# Patient Record
Sex: Female | Born: 1970
Health system: Southern US, Community
[De-identification: ages and names within clinical notes are randomized; demographics above are authoritative.]

## PROBLEM LIST (undated history)

## (undated) DIAGNOSIS — E781 Pure hyperglyceridemia: Secondary | ICD-10-CM

## (undated) DIAGNOSIS — F329 Major depressive disorder, single episode, unspecified: Secondary | ICD-10-CM

## (undated) DIAGNOSIS — R079 Chest pain, unspecified: Secondary | ICD-10-CM

## (undated) DIAGNOSIS — E669 Obesity, unspecified: Secondary | ICD-10-CM

## (undated) DIAGNOSIS — E282 Polycystic ovarian syndrome: Secondary | ICD-10-CM

## (undated) DIAGNOSIS — Z720 Tobacco use: Secondary | ICD-10-CM

## (undated) DIAGNOSIS — F419 Anxiety disorder, unspecified: Secondary | ICD-10-CM

## (undated) DIAGNOSIS — F32A Depression, unspecified: Secondary | ICD-10-CM

## (undated) DIAGNOSIS — R51 Headache: Secondary | ICD-10-CM

## (undated) DIAGNOSIS — J45909 Unspecified asthma, uncomplicated: Secondary | ICD-10-CM

## (undated) HISTORY — PX: CARDIAC CATHETERIZATION: SHX172

## (undated) HISTORY — DX: Headache: R51

## (undated) HISTORY — PX: CHOLECYSTECTOMY: SHX55

## (undated) HISTORY — PX: ECTOPIC PREGNANCY SURGERY: SHX613

## (undated) HISTORY — PX: TUBAL LIGATION: SHX77

## (undated) HISTORY — PX: DILATION AND CURETTAGE OF UTERUS: SHX78

---

## 2000-08-07 ENCOUNTER — Inpatient Hospital Stay (HOSPITAL_COMMUNITY): Admission: RE | Admit: 2000-08-07 | Discharge: 2000-08-08 | Payer: Self-pay | Admitting: *Deleted

## 2002-07-06 ENCOUNTER — Encounter: Payer: Self-pay | Admitting: Emergency Medicine

## 2002-07-06 ENCOUNTER — Emergency Department (HOSPITAL_COMMUNITY): Admission: EM | Admit: 2002-07-06 | Discharge: 2002-07-06 | Payer: Self-pay | Admitting: Emergency Medicine

## 2004-03-22 ENCOUNTER — Ambulatory Visit: Payer: Self-pay | Admitting: Psychiatry

## 2004-06-09 ENCOUNTER — Ambulatory Visit: Payer: Self-pay | Admitting: Psychiatry

## 2004-09-01 ENCOUNTER — Ambulatory Visit: Payer: Self-pay | Admitting: Psychiatry

## 2004-12-01 ENCOUNTER — Ambulatory Visit: Payer: Self-pay | Admitting: Psychiatry

## 2005-01-08 ENCOUNTER — Emergency Department (HOSPITAL_COMMUNITY): Admission: EM | Admit: 2005-01-08 | Discharge: 2005-01-08 | Payer: Self-pay | Admitting: Emergency Medicine

## 2005-03-09 ENCOUNTER — Ambulatory Visit: Payer: Self-pay | Admitting: Psychiatry

## 2005-06-06 ENCOUNTER — Ambulatory Visit: Payer: Self-pay | Admitting: Psychiatry

## 2005-08-31 ENCOUNTER — Ambulatory Visit (HOSPITAL_COMMUNITY): Payer: Self-pay | Admitting: Psychiatry

## 2005-11-28 ENCOUNTER — Ambulatory Visit (HOSPITAL_COMMUNITY): Payer: Self-pay | Admitting: Psychiatry

## 2006-03-22 ENCOUNTER — Ambulatory Visit (HOSPITAL_COMMUNITY): Payer: Self-pay | Admitting: Psychiatry

## 2006-06-12 ENCOUNTER — Ambulatory Visit (HOSPITAL_COMMUNITY): Payer: Self-pay | Admitting: Psychiatry

## 2006-09-20 ENCOUNTER — Ambulatory Visit (HOSPITAL_COMMUNITY): Payer: Self-pay | Admitting: Psychiatry

## 2006-12-06 ENCOUNTER — Ambulatory Visit (HOSPITAL_COMMUNITY): Payer: Self-pay | Admitting: Psychiatry

## 2007-02-12 ENCOUNTER — Ambulatory Visit (HOSPITAL_COMMUNITY): Payer: Self-pay | Admitting: Psychiatry

## 2007-03-12 ENCOUNTER — Ambulatory Visit (HOSPITAL_COMMUNITY): Payer: Self-pay | Admitting: Psychiatry

## 2007-05-28 ENCOUNTER — Ambulatory Visit (HOSPITAL_COMMUNITY): Payer: Self-pay | Admitting: Psychiatry

## 2007-07-25 ENCOUNTER — Ambulatory Visit (HOSPITAL_COMMUNITY): Payer: Self-pay | Admitting: Psychiatry

## 2007-10-22 ENCOUNTER — Ambulatory Visit (HOSPITAL_COMMUNITY): Payer: Self-pay | Admitting: Psychiatry

## 2008-01-21 ENCOUNTER — Ambulatory Visit (HOSPITAL_COMMUNITY): Payer: Self-pay | Admitting: Psychiatry

## 2008-06-11 ENCOUNTER — Ambulatory Visit (HOSPITAL_COMMUNITY): Payer: Self-pay | Admitting: Psychiatry

## 2008-09-15 ENCOUNTER — Ambulatory Visit (HOSPITAL_COMMUNITY): Payer: Self-pay | Admitting: Psychiatry

## 2009-01-05 ENCOUNTER — Ambulatory Visit (HOSPITAL_COMMUNITY): Payer: Self-pay | Admitting: Psychiatry

## 2009-04-06 ENCOUNTER — Ambulatory Visit (HOSPITAL_COMMUNITY): Payer: Self-pay | Admitting: Psychiatry

## 2009-06-29 ENCOUNTER — Ambulatory Visit (HOSPITAL_COMMUNITY): Payer: Self-pay | Admitting: Psychiatry

## 2009-08-26 ENCOUNTER — Ambulatory Visit (HOSPITAL_COMMUNITY): Payer: Self-pay | Admitting: Psychiatry

## 2009-09-23 ENCOUNTER — Ambulatory Visit (HOSPITAL_COMMUNITY): Payer: Self-pay | Admitting: Psychiatry

## 2009-12-23 ENCOUNTER — Ambulatory Visit (HOSPITAL_COMMUNITY): Payer: Self-pay | Admitting: Psychiatry

## 2010-04-28 ENCOUNTER — Ambulatory Visit (HOSPITAL_COMMUNITY): Payer: Self-pay | Admitting: Psychiatry

## 2010-05-31 ENCOUNTER — Ambulatory Visit (HOSPITAL_COMMUNITY)
Admission: RE | Admit: 2010-05-31 | Discharge: 2010-05-31 | Payer: Self-pay | Source: Home / Self Care | Attending: Psychiatry | Admitting: Psychiatry

## 2010-06-07 ENCOUNTER — Ambulatory Visit (HOSPITAL_COMMUNITY)
Admission: RE | Admit: 2010-06-07 | Discharge: 2010-06-07 | Payer: Self-pay | Source: Home / Self Care | Attending: Psychiatry | Admitting: Psychiatry

## 2010-07-12 ENCOUNTER — Encounter (INDEPENDENT_AMBULATORY_CARE_PROVIDER_SITE_OTHER): Payer: PRIVATE HEALTH INSURANCE | Admitting: Psychiatry

## 2010-07-12 DIAGNOSIS — F332 Major depressive disorder, recurrent severe without psychotic features: Secondary | ICD-10-CM

## 2010-09-30 NOTE — Discharge Summary (Signed)
[facility]  Patient:    Natalie Walker, Natalie Walker                         MRN: [id number] Adm. Date:  [id number] Disc. Date: [id number] Attending:  Jasmine Pang CC:         Jasmine Pang, M.D.  Drucie Opitz, RN   Discharge Summary  IDENTIFYING INFORMATION:  This is a 40 year old Caucasian female from [place], [place] referred by [facility].  Patient states she has been depressed on a long-term basis due to job stressors and a rigid infertility program that she is attempting to follow and some conflict with husband.  The depression escalated after she received a call from someone telling her that her husband was seeing another woman.  She became increasingly anxious, irritable, angry and felt that she wanted to hurt him. She also had panic attacks, significant anhedonia, anergia, difficulty concentrating and feelings of hopelessness and worthlessness.  She took approximately 15 Xanax in a suicide attempt.  PAST PSYCHIATRIC HISTORY:  Patient has not been treated in any psychiatric facility.  PAST MEDICAL HISTORY:  Patient is currently seen by an infertility specialist. She is on several medications for this including Clomid, Glucophage and HCG injections.  She is also on Xanax 0.5 mg p.o. once weekly (states she has no used it more than this).  She has no other acute medical problems.  MEDICATIONS:  As listed above.  ALLERGIES:  No known drug allergies.  PHYSICAL FINDINGS:  Physical examination was done in [facility] and was within normal limits at the time of transfer here.  FAMILY HISTORY:  Patients aunt committed suicide last June.  SUBSTANCE ABUSE HISTORY:  Patient denies any use.  SOCIAL HISTORY:  Patient lives with husband.  They are currently attempting to have a baby and have gone through multiple infertility trials.  She states this has been very depressing and demoralizing.  She also notes that she is more  depressed when she is on the infertility medication.  She works as a Lawyer at [employer].  She wants to go back to school to become a respiratory therapist.  She denies any legal problems.  ADMISSION MENTAL STATUS EXAMINATION:  Patient was friendly, cooperative, casually-dressed, Caucasian female with good eye contact.  She had some psychomotor retardation.  Speech was soft and slow.  Mood depressed and anxious.  Affect consistent with mood but able to smile at appropriate times. She denied current suicidal ideation or homicidal ideation or aggressive tendencies or self-injurious behavior.  There was no psychosis or perceptual disturbance.  Thought processes were logical and goal directed.  Thought content revealed no predominant theme.  On cognitive exam, patient was alert and oriented x 4.  Short-term and long-term memory were adequate.  General fund of knowledge age and education level appropriate.  Attention and concentration were decreased.  Judgment good.  Insight good.  ADMISSION DIAGNOSES: Axis I:    Major depression, recurrent, severe without psychosis. Axis II:   Deferred. Axis III:  Infertility workup. Axis IV:   Severe. Axis V:    Current Global Assessment of Functioning 50; highest past year 70.  STRENGTHS AND ASSETS:  Patient has a help-seeking attitude.  She has a supportive husband.  PROBLEM:  Mood instability with suicidal ideation.  SHORT-TERM TREATMENT GOAL:  Resolution of suicidal ideation.  LONG-TERM TREATMENT GOAL:  Resolution of mood instability.  HOSPITAL COURSE:  Patient stayed for a  brief crisis stabilization.  She was no longer suicidal and regretted having taken the overdose of Xanax.  She had been in [facility] ICU for at least two days for observation and was sent here as a precautionary measure.  She agreed to follow up in therapy and medication management at [facility] in [place]  with me and Drucie Opitz, RN.  Her husband was very supportive and agreed with the discharge.  DISCHARGE MENTAL STATUS EXAMINATION:  Mood and affect were improved.  Patient was less depressed.  She was not suicidal or homicidal.  She was less anxious with no psychosis or perceptual disturbance.  There was no aggression or self-injurious behavior.  Thought processes were logical and goal directed. Cognitively intact.  Judgment good.  Insight good.  DISCHARGE DIAGNOSES: Axis I:    Major depression, recurrent, severe without psychosis. Axis II:   Deferred. Axis III:  1. In the process of infertility workup.            2. Polycystic ovary disease. Axis IV:   Severe. Axis V:    Global Assessment of Functioning:  Current 50; highest past year            70; admission status 50.  DISCHARGE MEDICATIONS: 1. Prozac 20 mg q.a.m. will be started.  Patient was on this before and states    this was helpful. 2. Xanax 0.5 mg 1/2 pill b.i.d. p.r.n. for anxiety. 3. She has decided that she will most likely not continue her infertility    regimen.  DISCHARGE INSTRUCTIONS:  Activity level:  No restrictions.  Diet:  No restrictions.  POST-HOSPITAL CARE PLANS:  Patient will return to see me on Sep 20, 2000 at 1 p.m. at the [facility].  She will see Drucie Opitz on August 29, 2000 at 10:15 a.m. DD:  08/08/00 TD:  08/09/00 Job: [id number]

## 2010-10-04 ENCOUNTER — Encounter (INDEPENDENT_AMBULATORY_CARE_PROVIDER_SITE_OTHER): Payer: PRIVATE HEALTH INSURANCE | Admitting: Psychiatry

## 2010-10-04 DIAGNOSIS — F331 Major depressive disorder, recurrent, moderate: Secondary | ICD-10-CM

## 2010-10-25 ENCOUNTER — Encounter (INDEPENDENT_AMBULATORY_CARE_PROVIDER_SITE_OTHER): Payer: PRIVATE HEALTH INSURANCE | Admitting: Psychiatry

## 2010-10-25 DIAGNOSIS — F332 Major depressive disorder, recurrent severe without psychotic features: Secondary | ICD-10-CM

## 2010-12-06 ENCOUNTER — Encounter (INDEPENDENT_AMBULATORY_CARE_PROVIDER_SITE_OTHER): Payer: PRIVATE HEALTH INSURANCE | Admitting: Psychiatry

## 2010-12-06 DIAGNOSIS — F331 Major depressive disorder, recurrent, moderate: Secondary | ICD-10-CM

## 2010-12-13 ENCOUNTER — Encounter (INDEPENDENT_AMBULATORY_CARE_PROVIDER_SITE_OTHER): Payer: PRIVATE HEALTH INSURANCE | Admitting: Psychiatry

## 2010-12-13 DIAGNOSIS — F331 Major depressive disorder, recurrent, moderate: Secondary | ICD-10-CM

## 2010-12-26 ENCOUNTER — Ambulatory Visit (INDEPENDENT_AMBULATORY_CARE_PROVIDER_SITE_OTHER): Payer: PRIVATE HEALTH INSURANCE | Admitting: Psychiatry

## 2010-12-26 DIAGNOSIS — F331 Major depressive disorder, recurrent, moderate: Secondary | ICD-10-CM

## 2011-01-06 ENCOUNTER — Encounter (HOSPITAL_COMMUNITY): Payer: PRIVATE HEALTH INSURANCE | Admitting: Psychiatry

## 2011-01-31 ENCOUNTER — Encounter (INDEPENDENT_AMBULATORY_CARE_PROVIDER_SITE_OTHER): Payer: PRIVATE HEALTH INSURANCE | Admitting: Psychiatry

## 2011-01-31 DIAGNOSIS — F332 Major depressive disorder, recurrent severe without psychotic features: Secondary | ICD-10-CM

## 2011-02-27 ENCOUNTER — Encounter (HOSPITAL_COMMUNITY): Payer: PRIVATE HEALTH INSURANCE | Admitting: Psychiatry

## 2011-03-01 ENCOUNTER — Encounter (HOSPITAL_COMMUNITY): Payer: PRIVATE HEALTH INSURANCE | Admitting: Psychiatry

## 2011-03-26 ENCOUNTER — Emergency Department (HOSPITAL_COMMUNITY): Payer: PRIVATE HEALTH INSURANCE

## 2011-03-26 ENCOUNTER — Emergency Department (HOSPITAL_COMMUNITY)
Admission: EM | Admit: 2011-03-26 | Discharge: 2011-03-26 | Disposition: A | Discharge status: Home / Self Care | Payer: PRIVATE HEALTH INSURANCE | Attending: Emergency Medicine | Admitting: Emergency Medicine

## 2011-03-26 DIAGNOSIS — R110 Nausea: Secondary | ICD-10-CM | POA: Insufficient documentation

## 2011-03-26 DIAGNOSIS — R10811 Right upper quadrant abdominal tenderness: Secondary | ICD-10-CM | POA: Insufficient documentation

## 2011-03-26 DIAGNOSIS — R350 Frequency of micturition: Secondary | ICD-10-CM | POA: Insufficient documentation

## 2011-03-26 DIAGNOSIS — R109 Unspecified abdominal pain: Secondary | ICD-10-CM | POA: Insufficient documentation

## 2011-03-26 DIAGNOSIS — F17200 Nicotine dependence, unspecified, uncomplicated: Secondary | ICD-10-CM | POA: Insufficient documentation

## 2011-03-26 HISTORY — DX: Polycystic ovarian syndrome: E28.2

## 2011-03-26 LAB — URINALYSIS, ROUTINE W REFLEX MICROSCOPIC
Glucose, UA: NEGATIVE mg/dL
Leukocytes, UA: NEGATIVE
Nitrite: NEGATIVE
Protein, ur: NEGATIVE mg/dL
Urobilinogen, UA: 0.2 mg/dL (ref 0.0–1.0)

## 2011-03-26 LAB — COMPREHENSIVE METABOLIC PANEL
BUN: 14 mg/dL (ref 6–23)
CO2: 26 mEq/L (ref 19–32)
Chloride: 107 mEq/L (ref 96–112)
Creatinine, Ser: 0.69 mg/dL (ref 0.50–1.10)
GFR calc Af Amer: >90 mL/min (ref >90)
GFR calc non Af Amer: >90 mL/min (ref >90)
Glucose, Bld: 100 mg/dL — ABNORMAL HIGH (ref 70–99)
Total Bilirubin: 0.3 mg/dL (ref 0.3–1.2)

## 2011-03-26 LAB — DIFFERENTIAL
Eosinophils Relative: 3 % (ref 0–5)
Lymphocytes Relative: 28 % (ref 12–46)
Monocytes Absolute: 0.7 10*3/uL (ref 0.1–1.0)
Monocytes Relative: 8 % (ref 3–12)
Neutro Abs: 5.2 10*3/uL (ref 1.7–7.7)

## 2011-03-26 LAB — CBC
HCT: 39.7 % (ref 36.0–46.0)
Hemoglobin: 13.2 g/dL (ref 12.0–15.0)
MCV: 87.6 fL (ref 78.0–100.0)
WBC: 8.6 10*3/uL (ref 4.0–10.5)

## 2011-03-26 LAB — LIPASE, BLOOD: Lipase: 60 U/L — ABNORMAL HIGH (ref 11–59)

## 2011-03-26 LAB — URINE MICROSCOPIC-ADD ON

## 2011-03-26 MED ORDER — CYCLOBENZAPRINE HCL 5 MG PO TABS: 5.0000 mg | ORAL_TABLET | Freq: Three times a day (TID) | ORAL | Status: AC | PRN

## 2011-03-26 MED ORDER — HYDROCODONE-ACETAMINOPHEN 5-325 MG PO TABS: 1.0000 | ORAL_TABLET | Freq: Four times a day (QID) | ORAL | Status: AC | PRN

## 2011-03-26 MED ORDER — HYDROMORPHONE HCL PF 1 MG/ML IJ SOLN
1.0000 mg | Freq: Once | INTRAMUSCULAR | Status: AC
  Administered 2011-03-26: 1 mg via INTRAVENOUS
  Filled 2011-03-26: qty 1

## 2011-03-26 MED ORDER — NAPROXEN 500 MG PO TABS: 500.0000 mg | ORAL_TABLET | Freq: Two times a day (BID) | ORAL | Status: DC

## 2011-03-26 MED ORDER — SODIUM CHLORIDE 0.9 % IV SOLN
999.0000 mL | INTRAVENOUS | Status: DC
  Administered 2011-03-26: 20:00:00 via INTRAVENOUS

## 2011-03-26 MED ORDER — ONDANSETRON HCL 4 MG/2ML IJ SOLN
4.0000 mg | Freq: Once | INTRAMUSCULAR | Status: AC
  Administered 2011-03-26: 4 mg via INTRAVENOUS
  Filled 2011-03-26: qty 2

## 2011-03-26 NOTE — ED Provider Notes (Signed)
History     CSN: [id number] Arrival date & time: 03/26/2011  6:48 PM   First MD Initiated Contact with Patient 03/26/11 1841      Chief Complaint  Patient presents with  . Flank Pain  . Nausea  . Urinary Frequency    (Consider location/radiation/quality/duration/timing/severity/associated sxs/prior treatment) HPI Patient states she's been having pain in her right flank for the last 2 weeks. Today however the pain became very severe. She could not get any relief with over-the-counter medications. The pain seems to be located in the right flank it moves to the right upper abdomen. It is sharp in nature and severe. It has been associated with nausea and vomiting. There's been no diarrhea or fevers. Patient's had some urinary frequency but no dysuria. She has not noticed any blood in her urine. There's been no chest pain shortness of breath or other complaints. Past Medical History  Diagnosis Date  . Polycystic ovarian disease     Past Surgical History  Procedure Date  . Cesarean section   . Ectopic pregnancy surgery   . Cholecystectomy   . Dilation and curettage of uterus     No family history on file.  History  Substance Use Topics  . Smoking status: Current Everyday Smoker -- 0.5 packs/day  . Smokeless tobacco: Not on file  . Alcohol Use: No    OB History    Grav Para Term Preterm Abortions TAB SAB Ect Mult Living                  Review of Systems  All other systems reviewed and are negative.    Allergies  Review of patient's allergies indicates no known allergies.  Home Medications   Current Outpatient Rx  Name Route Sig Dispense Refill  . ACETAMINOPHEN 500 MG PO TABS Oral Take 1,000 mg by mouth every 6 (six) hours as needed. pain     . ALPRAZOLAM 0.5 MG PO TABS Oral Take 1 mg by mouth at bedtime.      . FLUOXETINE HCL 40 MG PO CAPS Oral Take 40 mg by mouth daily.      . IBUPROFEN 200 MG PO TABS Oral Take 800 mg by mouth every 6 (six) hours as needed.  pain     . METFORMIN HCL 1000 MG PO TABS Oral Take 1,000 mg by mouth 2 (two) times daily.      Marland Kitchen ONE-DAILY MULTI VITAMINS PO TABS Oral Take 1 tablet by mouth daily.      . CYCLOBENZAPRINE HCL 5 MG PO TABS Oral Take 1 tablet (5 mg total) by mouth 3 (three) times daily as needed for muscle spasms. 21 tablet 0  . HYDROCODONE-ACETAMINOPHEN 5-325 MG PO TABS Oral Take 1 tablet by mouth every 6 (six) hours as needed for pain. 20 tablet 0  . NAPROXEN 500 MG PO TABS Oral Take 1 tablet (500 mg total) by mouth 2 (two) times daily with a meal. As needed for pain 20 tablet 0    BP 141/63  Pulse 84  Temp(Src) 98.5 F (36.9 C) (Oral)  Resp 16  Ht 5\' 3"  (1.6 m)  Wt 266 lb (120.657 kg)  BMI 47.12 kg/m2  SpO2 99%  LMP 02/27/2011  Physical Exam  Nursing note and vitals reviewed. Constitutional: She appears well-developed and well-nourished. She appears distressed.  HENT:  Head: Normocephalic and atraumatic.  Right Ear: External ear normal.  Left Ear: External ear normal.  Eyes: Conjunctivae are normal. Right eye exhibits no discharge.  Left eye exhibits no discharge. No scleral icterus.  Neck: Neck supple. No tracheal deviation present.  Cardiovascular: Normal rate, regular rhythm and intact distal pulses.   Pulmonary/Chest: Effort normal and breath sounds normal. No stridor. No respiratory distress. She has no wheezes. She has no rales.  Abdominal: Soft. Bowel sounds are normal. She exhibits no distension. There is tenderness in the right upper quadrant. There is CVA tenderness (right-sided). There is no rigidity, no rebound, no guarding and negative Murphy's sign.  Musculoskeletal: She exhibits no edema and no tenderness.  Neurological: She is alert. She has normal strength. No sensory deficit. Cranial nerve deficit:  no gross defecits noted. She exhibits normal muscle tone. She displays no seizure activity. Coordination normal.  Skin: Skin is warm and dry. No rash noted.  Psychiatric: She has a  normal mood and affect.    ED Course  Procedures (including critical care time)  Medications  0.9 %  sodium chloride infusion ( mL Intravenous New Bag 03/26/11 1930)  metFORMIN (GLUCOPHAGE) 1000 MG tablet (not administered)  FLUoxetine (PROZAC) 40 MG capsule (not administered)  ALPRAZolam (XANAX) 0.5 MG tablet (not administered)  Multiple Vitamin (MULTIVITAMIN) tablet (not administered)  ibuprofen (ADVIL,MOTRIN) 200 MG tablet (not administered)  acetaminophen (TYLENOL) 500 MG tablet (not administered)  HYDROmorphone (DILAUDID) injection 1 mg (not administered)  HYDROmorphone (DILAUDID) injection 1 mg (1 mg Intravenous Given 03/26/11 1931)  ondansetron (ZOFRAN) injection 4 mg (4 mg Intravenous Given 03/26/11 1930)    Labs Reviewed  COMPREHENSIVE METABOLIC PANEL - Abnormal; Notable for the following:    Glucose, Bld 100 (*)    Albumin 3.3 (*)    All other components within normal limits  LIPASE, BLOOD - Abnormal; Notable for the following:    Lipase 60 (*)    All other components within normal limits  URINALYSIS, ROUTINE W REFLEX MICROSCOPIC - Abnormal; Notable for the following:    Appearance CLOUDY (*)    Specific Gravity, Urine >1.030 (*)    Hgb urine dipstick TRACE (*)    Ketones, ur TRACE (*)    All other components within normal limits  URINE MICROSCOPIC-ADD ON - Abnormal; Notable for the following:    Squamous Epithelial / LPF FEW (*)    Crystals CA OXALATE CRYSTALS (*)    All other components within normal limits  CBC  DIFFERENTIAL   Ct Abdomen Pelvis Wo Contrast  03/26/2011  *RADIOLOGY REPORT*  Clinical Data: Right flank pain for 1 week; nausea.  CT ABDOMEN AND PELVIS WITHOUT CONTRAST  Technique:  Multidetector CT imaging of the abdomen and pelvis was performed following the standard protocol without intravenous contrast.  Comparison: None.  Findings: The visualized lung bases are clear.  The liver is grossly unremarkable in appearance.  The spleen is enlarged,  measuring 14.8 cm in length.  The patient is status post cholecystectomy, with clips noted at the gallbladder fossa.  No intrahepatic biliary ductal dilatation is seen.  The pancreas and adrenal glands are unremarkable in appearance.  The kidneys are unremarkable in appearance.  There is no evidence of hydronephrosis.  No renal or ureteral stones are seen.  No perinephric stranding is appreciated.  No free fluid is identified.  The small bowel is unremarkable in appearance.  The stomach is within normal limits.  No acute vascular abnormalities are seen.  The appendix is normal in caliber, without evidence for appendicitis.  Relatively dense stool is noted at the cecum; the appearance is slightly unusual, though there is no evidence of  cecal wall thickening.  This most likely reflects stool, but could conceivably reflect an ileocecal mass; further evaluation is suggested.  The colon is partially filled with air, and is unremarkable in appearance.  The bladder is mildly distended and unremarkable in appearance. The uterus is within normal limits.  The ovaries are symmetric; no suspicious adnexal masses are seen.  No inguinal lymphadenopathy is seen.  No acute osseous abnormalities are identified.  IMPRESSION:  1.  No acute abnormalities identified within the abdomen or pelvis. 2.  Splenomegaly noted. 3.  Relatively unusual appearance to dense stool at the cecum.  No evidence of cecal wall thickening; though this most likely reflects stool, it could conceivably reflect an ileocecal mass.  Colonoscopy would be helpful for further evaluation, when and as deemed clinically appropriate.  Original Report Authenticated By: Tonia Ghent, M.D.     1. Flank pain       MDM  8:55 PM patient still having flank pain. Symptoms are concerning for possible renal colic. CT scan without contrast will be ordered to evaluate further. 10:10 PM patient without signs of kidney stone. No evidence of appendicitis. The possibility  of a needle cecal mass with discuss with the patient I suggested she followup with her doctor to arrange for an outpatient colonoscopy. Sharp pain that she describes could be radicular in nature. At this point it does not appear to be any evidence of acute emergency medical condition. Patient was discharged home with a prescription for pain medications.       Celene Kras, MD 03/26/11 [id number]

## 2011-03-26 NOTE — ED Notes (Signed)
Pt transported to CT ?

## 2011-03-26 NOTE — ED Notes (Signed)
Pt presents with right sided flank pain and nausea. Pt also states she has increase in urinary frequency. Pt states symptoms started 2 weeks ago.

## 2011-04-17 ENCOUNTER — Other Ambulatory Visit (HOSPITAL_COMMUNITY): Payer: Self-pay | Admitting: Psychiatry

## 2011-04-19 ENCOUNTER — Ambulatory Visit (HOSPITAL_COMMUNITY): Payer: PRIVATE HEALTH INSURANCE | Admitting: Psychiatry

## 2011-04-25 ENCOUNTER — Ambulatory Visit (INDEPENDENT_AMBULATORY_CARE_PROVIDER_SITE_OTHER): Payer: PRIVATE HEALTH INSURANCE | Admitting: Psychiatry

## 2011-04-25 ENCOUNTER — Encounter (HOSPITAL_COMMUNITY): Payer: Self-pay | Admitting: Psychiatry

## 2011-04-25 ENCOUNTER — Encounter (HOSPITAL_COMMUNITY): Payer: PRIVATE HEALTH INSURANCE | Admitting: Psychiatry

## 2011-04-25 VITALS — Wt 296.0 lb

## 2011-04-25 DIAGNOSIS — F331 Major depressive disorder, recurrent, moderate: Secondary | ICD-10-CM

## 2011-04-25 MED ORDER — ARIPIPRAZOLE 5 MG PO TABS: 5.0000 mg | ORAL_TABLET | Freq: Every day | ORAL | Status: DC

## 2011-04-25 MED ORDER — FLUOXETINE HCL 40 MG PO CAPS: 40.0000 mg | ORAL_CAPSULE | Freq: Every day | ORAL | Status: DC

## 2011-04-25 MED ORDER — ALPRAZOLAM 1 MG PO TABS: 1.0000 mg | ORAL_TABLET | Freq: Three times a day (TID) | ORAL | Status: DC | PRN

## 2011-04-25 NOTE — Progress Notes (Signed)
Natalie Marian East[birth year][id number]  Subjective Patient came for her followup appointment. She was recently seen by her primary care physician and had a emergency room visit after complaining of abdominal pain. She was found polyp in her gut which is now removed. Patient has gained almost 20 pound in past 3 months. It is unclear that weight gain is due to Abilify or physical reason. She also taking metformin for overstimulation of ovary. Overall her anxiety depression is stable on Abilify and Prozac. She also takes Xanax 3 times a day for her nervousness and panic attack. She denies overusing her Xanax or getting early refill. She denies any drinking or using drugs. Recently she's been anxious but like her current medication.  Mental Status Examination Patient is casually dressed and fairly groomed. She is obese and maintained fair eye contact. She reported her mood is anxious and her affect is constricted. She denies any auditory or visual hallucination. She denies any active or passive suicidal thoughts or homicidal thoughts. Her speech is soft clear and coherent. Her attention and concentration is fair. She's alert and oriented x3. Her insight judgment and impulse control is okay  Current Medication Current outpatient prescriptions:ALPRAZolam (XANAX) 1 MG tablet, Take 1 tablet (1 mg total) by mouth 3 (three) times daily as needed for anxiety., Disp: 90 tablet, Rfl: 1;  ARIPiprazole (ABILIFY) 5 MG tablet, Take 1 tablet (5 mg total) by mouth daily., Disp: 30 tablet, Rfl: 1;  FLUoxetine (PROZAC) 40 MG capsule, Take 1 capsule (40 mg total) by mouth daily., Disp: 30 capsule, Rfl: 1 metFORMIN (GLUCOPHAGE) 1000 MG tablet, Take 1,000 mg by mouth 2 (two) times daily.  , Disp: , Rfl: ;  acetaminophen (TYLENOL) 500 MG tablet, Take 1,000 mg by mouth every 6 (six) hours as needed. pain , Disp: , Rfl: ;  ibuprofen (ADVIL,MOTRIN) 200 MG tablet, Take 800 mg by mouth every 6 (six) hours as needed. pain , Disp: , Rfl: ;   Multiple Vitamin (MULTIVITAMIN) tablet, Take 1 tablet by mouth daily.  , Disp: , Rfl:  naproxen (NAPROSYN) 500 MG tablet, Take 1 tablet (500 mg total) by mouth 2 (two) times daily with a meal. As needed for pain, Disp: 20 tablet, Rfl: 0   Assessment Maj. depressive disorder  Plan I will continue her current medication however if patient continued to gain weight we will consider stopping Abilify. Patient want to monitor her weight since her polyp is removed and wondering if the polyp has causing weight gain. I explained risks and benefits of medication. I recommended to call me if she has any question about the medication or feeling worsening of her symptoms. I will see her again in 2 months

## 2011-06-29 ENCOUNTER — Encounter (HOSPITAL_COMMUNITY): Payer: Self-pay | Admitting: Psychiatry

## 2011-06-29 ENCOUNTER — Encounter (HOSPITAL_COMMUNITY): Payer: Self-pay | Admitting: *Deleted

## 2011-06-29 ENCOUNTER — Ambulatory Visit (INDEPENDENT_AMBULATORY_CARE_PROVIDER_SITE_OTHER): Payer: PRIVATE HEALTH INSURANCE | Admitting: Psychiatry

## 2011-06-29 DIAGNOSIS — F331 Major depressive disorder, recurrent, moderate: Secondary | ICD-10-CM

## 2011-06-29 DIAGNOSIS — F329 Major depressive disorder, single episode, unspecified: Secondary | ICD-10-CM | POA: Insufficient documentation

## 2011-06-29 MED ORDER — FLUOXETINE HCL 40 MG PO CAPS: 40.0000 mg | ORAL_CAPSULE | Freq: Every day | ORAL | Status: DC

## 2011-06-29 MED ORDER — ALPRAZOLAM 1 MG PO TABS: 1.0000 mg | ORAL_TABLET | Freq: Three times a day (TID) | ORAL | Status: DC | PRN

## 2011-06-29 NOTE — Progress Notes (Signed)
Chief complaint I'm is still gaining weight  History of present illness Patient is 41 year old Caucasian married employed female who came for her followup appointment. She is long history of depression. She is taking Abilify however recently she stopped taking it due to the fear of weight gain. Her weight today is 315 lbs which is actually almost 50 pound more from November 2012. Patient has recently seen her doctor and have thyroid studies done which was normal. Patient admitted recently she has been more tired with decreased energy due to increased weight gain. She is on Abilify 5 mg however it is unclear if Abilify is causing that much wt gain. Overall her mood has been stable and she denies any agitation anger or crying spells. She's off from Abilify and wants to see if she able to lose some weight. She is sleeping 6-7 hours without any problem.  Medical history Patient has significant obesity, polycystic ovarian disease and chronic pain. She takes metformin for weight loss.   Mental status examination Patient is casually dressed and fairly groomed. She is morbid obese but maintained good eye contact. She is pleasant calm and cooperative. Her speech is clear and coherent. She appears tired but overall well related in conversation. She denies any active or passive suicidal thinking and homicidal thinking. She described her mood is okay and her affect is mood congruent. She denies any auditory or visual hallucination. There no psychotic symptoms present. She's alert and oriented x3. Her insight judgment and impulse control is okay.  Assessment Axis I Major depressive disorder Axis II deferred Axis III see medical history Axis IV mild to moderate Axis V 70 - 75  Plan I will discontinue Abilify at this time as it is unclear if Abilify causing weight gain. I encourage her to start weight loss program since she has extensive medical workup to rule out weight gain. I recommended to call if she is  feeling more depressed or any time having suicidal thoughts or homicidal thoughts continue to call 911 or go to local ER. I will continue Xanax and Prozac as prescribed. At this time patient reported no side effects with these medication. I will see her again in 2 months. Time spent 20 minutes

## 2011-08-24 ENCOUNTER — Encounter (HOSPITAL_COMMUNITY): Payer: Self-pay | Admitting: Psychiatry

## 2011-08-24 ENCOUNTER — Ambulatory Visit (INDEPENDENT_AMBULATORY_CARE_PROVIDER_SITE_OTHER): Payer: PRIVATE HEALTH INSURANCE | Admitting: Psychiatry

## 2011-08-24 VITALS — Wt 318.0 lb

## 2011-08-24 DIAGNOSIS — F331 Major depressive disorder, recurrent, moderate: Secondary | ICD-10-CM

## 2011-08-24 MED ORDER — FLUOXETINE HCL 40 MG PO CAPS: 40.0000 mg | ORAL_CAPSULE | Freq: Every day | ORAL | Status: DC

## 2011-08-24 MED ORDER — ALPRAZOLAM 1 MG PO TABS: 1.0000 mg | ORAL_TABLET | Freq: Three times a day (TID) | ORAL | Status: DC | PRN

## 2011-08-24 NOTE — Progress Notes (Signed)
Chief complaint Medication management and followup.    History of present illness Patient is 41 year old Caucasian married employed female who came for her followup appointment.  She has gained 3 pounds more from her last visit .  On her last visit we have to stop Abilify believing it Abilify causing the weight gain however patient continues to have more gain weight despite watching her diet and doing regular exercise.  Patient is very concerned about her weight gain.  She is thinking to start weight watcher and balance program .  Overall her mood has been stable.  She denies any recent crying spells agitation her mood swing.  She sleeping better.  She likes her current medications Abilify and Xanax.  She's not abusing her benzodiazepine.  She does not task for early refills .  She's relief that she is able to get custody of her nephew which she adopted but later it was given back to his stepmother by court .  Patient is relief but this decision .  She is planning to go for a on weekend for vacation trip .  She does not want to go back on Abilify at this time as she is doing better her depression .  Her stress level from family and job is less intense .    Medical history Patient has significant obesity, polycystic ovarian disease and chronic pain. She takes metformin for weight loss.   Mental status examination Patient is casually dressed and fairly groomed. She is morbid obese but maintained good eye contact. She is pleasant calm and cooperative. Her speech is clear and coherent. She appears tired but overall well related in conversation. She denies any active or passive suicidal thinking and homicidal thinking. She described her mood is okay and her affect is mood congruent. She denies any auditory or visual hallucination. There no psychotic symptoms present. She's alert and oriented x3. Her insight judgment and impulse control is okay.  Assessment Axis I Major depressive disorder Axis II  deferred Axis III see medical history Axis IV mild to moderate Axis V 70 - 75  Plan I discuss with patient about her weight gain.  I believe her polycystic ovary disease causing excessive weight gain .  Patient will continue to followup with her physician , she is considering weight watcher and wellness program .  I will continue Prozac and Xanax at her present dose.  We will not start Abilify at this time .  Patient is doing better without Abilify.  I explained risks and benefits of medication .  I recommended to call us if she feels worsening of her symptoms or any issues with her medication.  I will see her again in 2 months.

## 2011-10-24 ENCOUNTER — Encounter (HOSPITAL_COMMUNITY): Payer: Self-pay | Admitting: Psychiatry

## 2011-10-24 ENCOUNTER — Ambulatory Visit (INDEPENDENT_AMBULATORY_CARE_PROVIDER_SITE_OTHER): Payer: PRIVATE HEALTH INSURANCE | Admitting: Psychiatry

## 2011-10-24 VITALS — Wt 305.0 lb

## 2011-10-24 DIAGNOSIS — F331 Major depressive disorder, recurrent, moderate: Secondary | ICD-10-CM

## 2011-10-24 DIAGNOSIS — F329 Major depressive disorder, single episode, unspecified: Secondary | ICD-10-CM

## 2011-10-24 MED ORDER — ESCITALOPRAM OXALATE 10 MG PO TABS: ORAL_TABLET | ORAL | Status: DC

## 2011-10-24 MED ORDER — ALPRAZOLAM 1 MG PO TABS: 1.0000 mg | ORAL_TABLET | Freq: Three times a day (TID) | ORAL | Status: DC | PRN

## 2011-10-24 MED ORDER — FLUOXETINE HCL 20 MG PO CAPS: 20.0000 mg | ORAL_CAPSULE | Freq: Every day | ORAL | Status: DC

## 2011-10-24 NOTE — Progress Notes (Signed)
Chief complaint I want to try a different medication.  I still have a lot of anxiety and stress.  History of presenting illness Patient is 41 year old Caucasian married employed female who came for her followup appointment.  She continued to endorse decreased energy , anxiety and depressive thoughts.  She is upset today as she could not get permission to go home to attend the funeral of her aunt who passed away this Oct 22, 2022.  Patient appears upset and sad .  She endorse that she has been taking Prozac for many years and believe it may stop working.  In the past we have tried Abilify with good response however patient developed weight gain .  Patient is able to lose weight from her last visit.  She dropped 10 pound in past 4 weeks.  She is watching her diet and closely monitor her calorie intake.  She's also drinking enough water and doing regular exercise.  She denies any agitation anger mood swing but endorse chronic depression with feeling of hopelessness and helplessness.  She admitted some crying spells however she denies any active or passive suicidal thoughts.  Her job is stressful .  She works as an Charity fundraiser at [employer].  She is not drinking or using any illegal substance.  Current psychiatric medication Prozac 40 mg daily  Xanax 1 mg 3 times a day  Past psychiatric history Patient has a previous history of psychiatric inpatient treatment or suicidal attempt.  She denies a history of psychosis paranoia violence.  In the past she has taken Wellbutrin to stop smoking however it has given significant headache.  She had a good response with Abilify however she gained weight with Abilify.  She is been taking Prozac for many years.  Medical history Patient has significant obesity, polycystic ovarian disease and chronic pain. She takes metformin for weight loss.  Mental status examination Patient is casually dressed and fairly groomed. She appears tired sad and frustrated.  She described her  mood is irritable and her affect is mood congruent.  Her speech is slow but clear and coherent.  Her thought process is logical linear goal-directed.  She maintained fair eye contact.  She is cooperative in conversation.  She denies any active or passive suicidal thoughts or homicidal thoughts.  She denies any auditory or visual hallucination.  There were no psychotic symptoms present at this time.  Her attention concentration is fair.  She's alert and oriented x3.  Her insight judgment and pulse control is okay.  Assessment Axis I Major depressive disorder Axis II deferred Axis III see medical history Axis IV mild to moderate Axis V 70 - 75  Plan I discussed the stressor and response to the medication.  Despite watching her calorie intake and exercise she does not believe that she has lose much weight.  She was to try a different medication.  She has never tried Lexapro.  I will recommend to try Lexapro 5 mg for one week and then gradually increase to 10 mg a day.  I also recommend to reduce her Prozac to 20 mg daily and she may stop after 2 weeks.  I recommend to call us if she is any question or concern about the medication or if she feel worsening of the symptoms.  I also talk about safety plan that anytime having suicidal thoughts or homicidal thoughts and she to call 911 or go to local emergency room.  I will see her again in 3 weeks.  Time spent 30 minutes.  Portion of this note is generated with voice recognition software and may contain typographical error.

## 2011-11-14 ENCOUNTER — Ambulatory Visit (HOSPITAL_COMMUNITY): Payer: Self-pay | Admitting: Psychiatry

## 2011-11-23 ENCOUNTER — Ambulatory Visit (INDEPENDENT_AMBULATORY_CARE_PROVIDER_SITE_OTHER): Payer: PRIVATE HEALTH INSURANCE | Admitting: Psychiatry

## 2011-11-23 ENCOUNTER — Encounter (HOSPITAL_COMMUNITY): Payer: Self-pay | Admitting: Psychiatry

## 2011-11-23 VITALS — Wt 295.0 lb

## 2011-11-23 DIAGNOSIS — F331 Major depressive disorder, recurrent, moderate: Secondary | ICD-10-CM

## 2011-11-23 MED ORDER — ESCITALOPRAM OXALATE 20 MG PO TABS: 20.0000 mg | ORAL_TABLET | Freq: Every day | ORAL | Status: DC

## 2011-11-23 NOTE — Progress Notes (Signed)
Chief complaint I am doing better.    History of presenting illness Patient is 41 year old Caucasian married employed female who came for her followup appointment.  On her last appointment we started her on Lexapro 10 mg and Prozac was reduced.  She's doing better on Lexapro.  She is taking 10 mg without any problem.  She denies any agitation anger mood swing.  She denies any tremors or shakes.  Her stress level is better however she continued to worried about her job.  Recently her hours are reduce due to closing of her floor.  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  Patient is thinking to visit beach with her family in next few weeks.  Overall she is doing better on Lexapro.  She denies any crying spells or any recent panic attack.  She is happy that she lost weight from her last visit.  She is eating better and feel more energy level to do things.  She lost almost 10 pounds in past few weeks.  She's not drinking or using any illegal substance.  Current psychiatric medication Prozac 20 mg daily  Xanax 1 mg 3 times a day Lexapro 10 mg daily  Past psychiatric history Patient has a previous history of psychiatric inpatient treatment or suicidal attempt.  She denies a history of psychosis paranoia violence.  In the past she has taken Wellbutrin to stop smoking however it has given significant headache.  She had a good response with Abilify however she gained weight with Abilify.  She is been taking Prozac for many years.  Medical history Patient has significant obesity, polycystic ovarian disease and chronic pain. She takes metformin for weight loss.  Mental status examination Patient is casually dressed and fairly groomed. She appears cooperative and pleasant.  She described her mood is good and her affect is mood appropriate.  She denies any auditory or visual hallucination.  She denies any active or passive suicidal thoughts or homicidal thoughts.  There were no flight of ideas or  loose association.  She maintained good eye contact.  There were no paranoia or delusion or psychotic symptoms present.  She's alert and oriented x3.  Her attention and concentration is improved from the past.  Her insight judgment and pulse control is okay.  Assessment Axis I Major depressive disorder Axis II deferred Axis III see medical history Axis IV mild to moderate Axis V 70 - 75  Plan I will discontinue Prozac and increase her Lexapro to 20 mg daily.  Patient is doing very well on Lexapro and reported no side effects.  She will continue Xanax as prescribed.  I recommend to call us if she is any question or concern about the medication or if she feels worsening of the symptoms.  I will see her again in 6 weeks.  Portion of this note is generated with voice recognition software and may contain typographical error.

## 2011-12-12 ENCOUNTER — Observation Stay (HOSPITAL_COMMUNITY)
Admit: 2011-12-12 | Discharge: 2011-12-13 | DRG: 287 | Disposition: A | Discharge status: Home / Self Care | Payer: No Typology Code available for payment source | Source: Ambulatory Visit | Attending: Cardiovascular Disease | Admitting: Cardiovascular Disease

## 2011-12-12 ENCOUNTER — Encounter (HOSPITAL_COMMUNITY): Payer: Self-pay | Admitting: *Deleted

## 2011-12-12 ENCOUNTER — Other Ambulatory Visit: Payer: Self-pay | Admitting: Physician Assistant

## 2011-12-12 ENCOUNTER — Encounter (HOSPITAL_COMMUNITY)
Disposition: A | Discharge status: Home / Self Care | Payer: Self-pay | Source: Ambulatory Visit | Attending: Cardiovascular Disease

## 2011-12-12 ENCOUNTER — Inpatient Hospital Stay (HOSPITAL_COMMUNITY): Admission: EM | Admit: 2011-12-12 | Payer: Self-pay | Source: Other Acute Inpatient Hospital | Admitting: Cardiology

## 2011-12-12 DIAGNOSIS — F411 Generalized anxiety disorder: Secondary | ICD-10-CM | POA: Insufficient documentation

## 2011-12-12 DIAGNOSIS — F3289 Other specified depressive episodes: Secondary | ICD-10-CM | POA: Insufficient documentation

## 2011-12-12 DIAGNOSIS — R072 Precordial pain: Secondary | ICD-10-CM

## 2011-12-12 DIAGNOSIS — F329 Major depressive disorder, single episode, unspecified: Secondary | ICD-10-CM | POA: Insufficient documentation

## 2011-12-12 DIAGNOSIS — R0789 Other chest pain: Principal | ICD-10-CM | POA: Insufficient documentation

## 2011-12-12 DIAGNOSIS — F331 Major depressive disorder, recurrent, moderate: Secondary | ICD-10-CM

## 2011-12-12 DIAGNOSIS — R51 Headache: Secondary | ICD-10-CM | POA: Insufficient documentation

## 2011-12-12 DIAGNOSIS — I200 Unstable angina: Secondary | ICD-10-CM

## 2011-12-12 DIAGNOSIS — R079 Chest pain, unspecified: Secondary | ICD-10-CM

## 2011-12-12 DIAGNOSIS — J45909 Unspecified asthma, uncomplicated: Secondary | ICD-10-CM | POA: Insufficient documentation

## 2011-12-12 DIAGNOSIS — E119 Type 2 diabetes mellitus without complications: Secondary | ICD-10-CM | POA: Insufficient documentation

## 2011-12-12 DIAGNOSIS — F17200 Nicotine dependence, unspecified, uncomplicated: Secondary | ICD-10-CM | POA: Insufficient documentation

## 2011-12-12 DIAGNOSIS — E669 Obesity, unspecified: Secondary | ICD-10-CM | POA: Insufficient documentation

## 2011-12-12 HISTORY — DX: Obesity, unspecified: E66.9

## 2011-12-12 HISTORY — DX: Unspecified asthma, uncomplicated: J45.909

## 2011-12-12 HISTORY — DX: Major depressive disorder, single episode, unspecified: F32.9

## 2011-12-12 HISTORY — PX: LEFT HEART CATHETERIZATION WITH CORONARY ANGIOGRAM: SHX5451

## 2011-12-12 HISTORY — DX: Pure hyperglyceridemia: E78.1

## 2011-12-12 HISTORY — DX: Anxiety disorder, unspecified: F41.9

## 2011-12-12 HISTORY — DX: Depression, unspecified: F32.A

## 2011-12-12 HISTORY — DX: Tobacco use: Z72.0

## 2011-12-12 HISTORY — DX: Chest pain, unspecified: R07.9

## 2011-12-12 LAB — GLUCOSE, CAPILLARY: Glucose-Capillary: 90 mg/dL (ref 70–99)

## 2011-12-12 SURGERY — LEFT HEART CATHETERIZATION WITH CORONARY ANGIOGRAM
Anesthesia: LOCAL

## 2011-12-12 MED ORDER — MIDAZOLAM HCL 2 MG/2ML IJ SOLN
INTRAMUSCULAR | Status: AC
  Filled 2011-12-12: qty 2

## 2011-12-12 MED ORDER — ACETAMINOPHEN 325 MG PO TABS: 650.0000 mg | ORAL_TABLET | ORAL | Status: DC | PRN

## 2011-12-12 MED ORDER — HEPARIN (PORCINE) IN NACL 2-0.9 UNIT/ML-% IJ SOLN
INTRAMUSCULAR | Status: AC
  Filled 2011-12-12: qty 1000

## 2011-12-12 MED ORDER — ONDANSETRON HCL 4 MG/2ML IJ SOLN: 4.0000 mg | Freq: Four times a day (QID) | INTRAMUSCULAR | Status: DC | PRN

## 2011-12-12 MED ORDER — LIDOCAINE HCL (PF) 1 % IJ SOLN
INTRAMUSCULAR | Status: AC
  Filled 2011-12-12: qty 30

## 2011-12-12 MED ORDER — SODIUM CHLORIDE 0.9 % IJ SOLN: 3.0000 mL | INTRAMUSCULAR | Status: DC | PRN

## 2011-12-12 MED ORDER — IBUPROFEN 800 MG PO TABS
800.0000 mg | ORAL_TABLET | Freq: Four times a day (QID) | ORAL | Status: DC | PRN
  Administered 2011-12-13: 06:00:00 800 mg via ORAL
  Filled 2011-12-12: qty 1

## 2011-12-12 MED ORDER — VERAPAMIL HCL 2.5 MG/ML IV SOLN
INTRAVENOUS | Status: AC
  Filled 2011-12-12: qty 2

## 2011-12-12 MED ORDER — SODIUM CHLORIDE 0.9 % IV SOLN: 250.0000 mL | INTRAVENOUS | Status: DC | PRN

## 2011-12-12 MED ORDER — ONE-DAILY MULTI VITAMINS PO TABS: 1.0000 | ORAL_TABLET | Freq: Every day | ORAL | Status: DC

## 2011-12-12 MED ORDER — PROMETHAZINE HCL 25 MG/ML IJ SOLN
12.5000 mg | Freq: Four times a day (QID) | INTRAMUSCULAR | Status: DC | PRN
  Filled 2011-12-12: qty 1

## 2011-12-12 MED ORDER — NITROGLYCERIN 0.4 MG SL SUBL: 0.4000 mg | SUBLINGUAL_TABLET | SUBLINGUAL | Status: DC | PRN

## 2011-12-12 MED ORDER — ALPRAZOLAM 0.25 MG PO TABS: 1.0000 mg | ORAL_TABLET | Freq: Three times a day (TID) | ORAL | Status: DC | PRN

## 2011-12-12 MED ORDER — NITROGLYCERIN 2 % TD OINT
1.0000 [in_us] | TOPICAL_OINTMENT | Freq: Four times a day (QID) | TRANSDERMAL | Status: DC
  Filled 2011-12-12: qty 30

## 2011-12-12 MED ORDER — NITROGLYCERIN 0.2 MG/ML ON CALL CATH LAB
INTRAVENOUS | Status: AC
  Filled 2011-12-12: qty 1

## 2011-12-12 MED ORDER — SODIUM CHLORIDE 0.9 % IJ SOLN: 3.0000 mL | Freq: Two times a day (BID) | INTRAMUSCULAR | Status: DC

## 2011-12-12 MED ORDER — ASPIRIN EC 81 MG PO TBEC
81.0000 mg | DELAYED_RELEASE_TABLET | Freq: Every day | ORAL | Status: DC
  Administered 2011-12-13: 10:00:00 81 mg via ORAL
  Filled 2011-12-12: qty 1

## 2011-12-12 MED ORDER — ADULT MULTIVITAMIN W/MINERALS CH
1.0000 | ORAL_TABLET | Freq: Every day | ORAL | Status: DC
  Administered 2011-12-12 – 2011-12-13 (×2): 1 via ORAL
  Filled 2011-12-12 (×2): qty 1

## 2011-12-12 MED ORDER — ONDANSETRON HCL 4 MG/2ML IJ SOLN
INTRAMUSCULAR | Status: AC
  Filled 2011-12-12: qty 2

## 2011-12-12 MED ORDER — ACETAMINOPHEN 500 MG PO TABS
1000.0000 mg | ORAL_TABLET | Freq: Four times a day (QID) | ORAL | Status: DC | PRN
  Administered 2011-12-13: 03:00:00 1000 mg via ORAL
  Filled 2011-12-12: qty 2

## 2011-12-12 MED ORDER — FENTANYL CITRATE 0.05 MG/ML IJ SOLN
INTRAMUSCULAR | Status: AC
  Filled 2011-12-12: qty 2

## 2011-12-12 MED ORDER — ESCITALOPRAM OXALATE 20 MG PO TABS
20.0000 mg | ORAL_TABLET | Freq: Every day | ORAL | Status: DC
  Administered 2011-12-12 – 2011-12-13 (×2): 20 mg via ORAL
  Filled 2011-12-12 (×2): qty 1

## 2011-12-12 MED ORDER — ATORVASTATIN CALCIUM 80 MG PO TABS
80.0000 mg | ORAL_TABLET | Freq: Every day | ORAL | Status: DC
  Administered 2011-12-12: 21:00:00 80 mg via ORAL
  Filled 2011-12-12 (×2): qty 1

## 2011-12-12 MED ORDER — HEPARIN SODIUM (PORCINE) 1000 UNIT/ML IJ SOLN
INTRAMUSCULAR | Status: AC
  Filled 2011-12-12: qty 1

## 2011-12-12 MED ORDER — SODIUM CHLORIDE 0.9 % IV SOLN
INTRAVENOUS | Status: AC
  Administered 2011-12-12: 19:00:00 via INTRAVENOUS

## 2011-12-12 NOTE — Progress Notes (Signed)
Patient oxygen saturation dropping to 87% while sleeping .Oxygen at 2lnc placed on patient and saturations increased to 96%.Will continue to monitor patient. me

## 2011-12-12 NOTE — H&P (Signed)
NAME:  LAKETRA, BOWDISH ROOM: 223  UNIT NUMBER:  [id number] LOCATION: [id number] ADM/VISIT DATE:  12/12/2011   ADM Vaughan BrownerKathaleen Grinder:  [id number] DOB: December 20, 1970   REQUESTING PHYSICIAN:  Kirstie Peri, MD  REASON FOR CONSULTATION:  Chest pain.  HISTORY OF PRESENT ILLNESS:  Ms. Perriello is a pleasant 42 year old woman with history of type 2 diabetes mellitus, asthma, anxiety and depression, obesity, but no clearly defined history of hypertension, cardiac dysrhythmia, or cardiovascular disease.  She was in her usual state of health, relaxing at home yesterday watching television when she developed a sudden sensation of chest pain, predominantly left-sided with some radiation into the neck, also associated with headache.  She indicated a feeling of heaviness, subsequently more of a sharp discomfort, and states that this has been essentially constant since that time, waxing and waning, perhaps somewhat less intense today.  Initial troponin levels are normal.  Her ECG shows sinus rhythm with T-wave inversions in the anterolateral leads and overall low voltage with leftward axis.  She has a normal D-dimer level of 0.42 and her chest x-ray reports decreased lung volumes with no acute cardiopulmonary process.  We are consulted to assist with her management.  ALLERGIES:  No known drug allergies.  CURRENT MEDICATIONS: 1. Metformin 1000 mg p.o. b.i.d. 2. Prozac 40 mg p.o. daily. 3. Xanax p.r.n.  PAST MEDICAL HISTORY:  As detailed above.  Additional problems include polycystic ovarian disease, history of cholecystectomy, tubal ligation, and cesarean section.  FAMILY HISTORY:  Reviewed.  The patient states that her father had cancer and atrial fibrillation.  She also has a brother that had some type of arrhythmia ablation, details not clear.  No definite history of premature cardiovascular disease.  SOCIAL HISTORY:  The patient denies any alcohol or illicit substance use.  She smokes less than a 1/2 pack per day  tobacco.  REVIEW OF SYSTEMS:  Detailed above.  She has been in her usual state of health until yesterday.  No reported fevers or chills, no cough or hemoptysis, no palpitations or syncope.  No leg edema.  No orthopnea or PND.  Stable appetite.  All others reviewed and negative.  PHYSICAL EXAMINATION:  Vital signs:  Temperature is 98.0 degrees, blood pressure is 111/53, heart rate is 57 and regular, respirations 20, oxygen saturation is 100% on 2 L nasal cannula, weight is 280 pounds.  General:  This is an obese woman complaining of sharp upper left chest discomfort, no breathlessness.  HEENT:  Conjunctivae and lids normal.  Oropharynx is clear with moist mucosa.  Neck:  Supple.  No elevated JVP or carotid bruits.  No thyromegaly is noted.  Lungs:  Clear with diminished breath sounds, nonlabored breathing, no wheezing or rhonchi.  Cardiac:  Exam reveals a regular rate and rhythm, no significant gallop or rub, no significant murmur.  Abdomen:  Nontender, bowel sounds present.  Extremities:  Exhibit no obvious pitting edema, distal pulses full.  Skin:  Warm and dry.  Musculoskeletal:  No kyphosis is noted.  Neuropsychiatric:  The patient is alert and oriented x3.  Affect is appropriate.  LABORATORY DATA:  Glucose 106, BUN 15, creatinine 0.6.  Sodium 139, potassium 3.1, chloride 105, bicarb 26.  Total CK 45, CK-MB 1.0, troponin I less than 0.01.  BNP is 18.  WBC 7000, hemoglobin 13.7, hematocrit 41.3, platelets 207,000.  Followup ECG done in the setting of chest pain shows progressive T-wave inversion in the anterolateral leads.  IMPRESSION: 1. New-onset chest pain  beginning yesterday, continuous in a waxing-and-waning pattern, mild improvement with nitroglycerin although not resolution, and now with progressive anterolateral T-wave inversions.  Her cardiac markers initially have been normal.  Symptoms have atypical and typical features, main cardiac risk factors including ongoing tobacco use, diabetes  mellitus, and obesity. 2. Type 2 diabetes mellitus, on metformin. 3. Obesity. 4. History of asthma. 5. History of anxiety and depression.  RECOMMENDATIONS:  Discussed situation in full with the patient.  We discussed noninvasive and invasive options for further evaluation of potential obstructive CAD.  Although symptoms have atypical and typical features, in a setting of diabetes, continued chest pain, and anterolateral progressive T-wave abnormalities, plan will be to transfer her to [facility] in anticipation of a diagnostic cardiac catheterization to clearly assess the coronary anatomy.  We discussed the risks and benefits of this and she is in agreement.  She has already received aspirin today, will be placed on nitroglycerin paste, also heparin.  No beta blocker as yet with bradycardia. Can use morphine for additional pain control if needed.  Further medication adjustments may be necessary depending on findings of her cardiac catheterization.  At this point lipid status is not certain. To start statin.   __________________________    Nona Dell, M.D. Alinda Money D: 12/12/2011 1204 T: 12/12/2011 1223 P: MCD1  cc:  Kirstie Peri, M.D.

## 2011-12-12 NOTE — CV Procedure (Signed)
   Cardiac Catheterization Procedure Note  Name: Natalie Walker MRN: [id number] DOB: 07-16-1970  Procedure: Left Heart Cath, Selective Coronary Angiography, LV angiography  Indication: Chest pain, patient with multiple cardiac risk factors, unable to control symptoms with medical therapy.   Procedural Details: The patient presented directly to the cardiac Cath Lab from [facility]. Upon arrival, the procedure was explained to her in detail. This discussion included the potential risks, indications, and alternatives to cardiac catheterization and PCI. She clearly understood the risks and agreed to proceed. However, she did not sign informed consent and as soon as we realizde that she had just been given Versed and fentanyl. We had her sign consent within just a few seconds of medicine administration and she was clearly alert and oriented when she signed.  The right wrist was prepped, draped, and anesthetized with 1% lidocaine. Using the modified Seldinger technique, a 5 French sheath was introduced into the right radial artery. 3 mg of verapamil was administered through the sheath, weight-based unfractionated heparin was administered intravenously. The diagnostic procedure was quite difficult because of a very steep angle between the innominate artery and the ascending aorta. Standard catheters would not conform or torque. For injection of the right coronary artery, a multipurpose guide catheter was utilized and a Wholey wire was inside of the catheter to provide support. Only a single image was able to be obtained but it was clearly a normal vessel. The left coronary artery was even more difficult to engage. Ultimately I was able to engage the vessel with an EZ rad left guide. A TR band was used for radial hemostasis at the completion of the procedure.  The patient was transferred to the post catheterization recovery area for further monitoring.  Procedural Findings: Hemodynamics: AO 96/75 LV  97/27  Coronary angiography: Coronary dominance: right  Left mainstem: Widely patent with no obstructive disease   Left anterior descending (LAD):  widely patent throughout no obstructive disease. The diagonal branches are patent. The vessel reaches the left ventricular apex   Left circumflex (LCx): Smooth throughout. No obstructive disease is present.  Right coronary artery (RCA):  dominant vessel, angiographically normal with a PDA and a posterolateral branch, both widely patent.   Left ventriculography: Left ventricular systolic function is normal, LVEF is estimated at 55-65%, there is no significant mitral regurgitation   Final Conclusions:   1. Widely patent coronary arteries 2. Normal left ventricular function   Recommendations: Reassurance. Suspect noncardiac chest pain. Note if she ever requires repeat cardiac cath I would suggest an alternative access site to the right radial. Tonny Bollman 12/12/2011, 6:13 PM

## 2011-12-12 NOTE — Interval H&P Note (Signed)
History and Physical Interval Note:  12/12/2011 5:12 PM  Natalie Walker  has presented today for surgery, with the diagnosis of chest pain  The various methods of treatment have been discussed with the patient and family. After consideration of risks, benefits and other options for treatment, the patient has consented to  Procedure(s) (LRB): LEFT HEART CATHETERIZATION WITH CORONARY ANGIOGRAM (N/A) as a surgical intervention .  The patient's history has been reviewed, patient examined, no change in status, stable for surgery.  I have reviewed the patient's chart and labs.  Questions were answered to the patient's satisfaction.     Tonny Bollman

## 2011-12-13 ENCOUNTER — Encounter (HOSPITAL_COMMUNITY): Payer: Self-pay | Admitting: *Deleted

## 2011-12-13 DIAGNOSIS — R072 Precordial pain: Secondary | ICD-10-CM

## 2011-12-13 LAB — LIPID PANEL
Cholesterol: 157 mg/dL (ref 0–200)
Total CHOL/HDL Ratio: 4.8 RATIO
Triglycerides: 206 mg/dL — ABNORMAL HIGH (ref <150)

## 2011-12-13 MED ORDER — METFORMIN HCL 1000 MG PO TABS: 1000.0000 mg | ORAL_TABLET | Freq: Two times a day (BID) | ORAL | Status: DC

## 2011-12-13 MED FILL — Ondansetron HCl Inj 4 MG/2ML (2 MG/ML): INTRAMUSCULAR | Qty: 2 | Status: AC

## 2011-12-13 MED FILL — Nitroglycerin IV Soln 200 MCG/ML in D5W: INTRAVENOUS | Qty: 250 | Status: AC

## 2011-12-13 MED FILL — Heparin Sodium (Porcine) 100 Unt/ML in Sodium Chloride 0.45%: INTRAMUSCULAR | Qty: 250 | Status: AC

## 2011-12-13 MED FILL — Morphine Sulfate Inj 10 MG/ML: INTRAMUSCULAR | Qty: 1 | Status: AC

## 2011-12-13 NOTE — Progress Notes (Signed)
    Subjective:  No chest pain overnight. No dyspnea. Nausea has resolved. Has residual headache ever since NTG patch placed.  Objective:  Vital Signs in the last 24 hours: Temp:  [97.4 F (36.3 C)-98.8 F (37.1 C)] 98.8 F (37.1 C) (07/31 0412) Pulse Rate:  [57-76] 76  (07/31 0500) Resp:  [9-18] 18  (07/31 0500) BP: (87-124)/(32-69) 113/63 mmHg (07/31 0500) SpO2:  [91 %-100 %] 100 % (07/31 0412) Weight:  [135 kg (297 lb 9.9 oz)] 135 kg (297 lb 9.9 oz) (07/31 0011)  Intake/Output from previous day:    Physical Exam: Pt is alert and oriented, pleasant obese woman in NAD HEENT: normal Neck: JVP - normal Lungs: CTA bilaterally CV: RRR without murmur or gallop Abd: soft, NT, Positive BS, no hepatomegaly Ext: no C/C/E, distal pulses intact and equal. Right radial site clear. Skin: warm/dry no rash  Lab Results: No results found for this basename: WBC:2,HGB:2,PLT:2 in the last 72 hours No results found for this basename: NA:2,K:2,CL:2,CO2:2,GLUCOSE:2,BUN:2,CREATININE:2 in the last 72 hours No results found for this basename: TROPONINI:2,CK,MB:2 in the last 72 hours  Tele: sinus rhythm, personally reviewed  Assessment/Plan:  1. Atypical chest pain, resolved. Clean cath yesterday. Reassurance and follow-up with PCP (Dr Sherryll Burger).  2. Diabetes, Type 2. Resume metformin 8/1  3. Other problems stable and continue same outpatient treatment. Follow-up with cardiology as needed.  Tonny Bollman, M.D. 12/13/2011, 7:29 AM

## 2011-12-13 NOTE — Discharge Summary (Signed)
Discharge Summary   Patient ID: DEETTA SIEGMANN MRN: [id number], DOB/AGE: 41-12-1970 41 y.o.  Primary MD: Kirstie Peri, MD Primary Cardiologist: None Admit date: 12/12/2011 D/C date:     12/13/2011      Primary Discharge Diagnoses:  1. Chest pain, Atypical  - Cardiac cath 7/30 revealed widely patent coronary arteries and normal left ventricular function  - Follow up with PCP  2. Hypertriglyceridemia  - Triglycerides 206  - Follow up with PCP  3. ?OSA  - Recommend follow up with PCP for possible outpatient sleep study   Secondary Discharge Diagnoses:  1. Diabetes Mellitus, Type 2 2. Tobacco Abuse 3. Obesity 4. Asthma 5. Polycystic ovarian disease 6. Anxiety 7. Depression 8. cholecystectomy 9. tubal ligation 10. cesarean section   Allergies No Known Allergies  Diagnostic Studies/Procedures:  12/12/11 - Cardiac Cath Hemodynamics:  AO 96/75  LV 97/27  Coronary angiography:  Coronary dominance: right  Left mainstem: Widely patent with no obstructive disease  Left anterior descending (LAD): widely patent throughout no obstructive disease. The diagonal branches are patent. The vessel reaches the left ventricular apex  Left circumflex (LCx): Smooth throughout. No obstructive disease is present.  Right coronary artery (RCA): dominant vessel, angiographically normal with a PDA and a posterolateral branch, both widely patent.  Left ventriculography: Left ventricular systolic function is normal, LVEF is estimated at 55-65%, there is no significant mitral regurgitation  Final Conclusions:  1. Widely patent coronary arteries  2. Normal left ventricular function  Recommendations: Reassurance. Suspect noncardiac chest pain.  Note if she ever requires repeat cardiac cath I would suggest an alternative access site to the right radial  History of Present Illness: 41 y.o. female w/ the above medical problems who presented to [facility] with complaints of chest pain and was  subsequently transferred to [facility] on 12/12/11 for cardiac catheterization.  On the day prior to presentation she was in her usual state of health, relaxing at home watching television when she developed a sudden sensation of chest pain, predominantly left-sided with some radiation into the neck, also associated with headache. She indicated a feeling of heaviness, subsequently more of a sharp discomfort, and states that this has been essentially constant since that time, waxing and waning, perhaps somewhat less intense on day of presentation.   Hospital Course: At [facility] initial troponin levels were normal. Her ECG showed sinus rhythm with TWI in the anterolateral leads and overall low voltage with leftward axis. She had a normal D-dimer level of 0.42 and her chest x-ray reported decreased lung volumes with no acute cardiopulmonary process. Given her cardiac risk factors and abnormal EKG she was placed on IV heparin, ASA, NTG paste and transferred to [facility] for diagnostic cardiac catheterization.   She arrived to the [facility] cath lab in stable condition. Cardiac catheterization revealed widely patent coronary arteries and normal LV function. She tolerated the procedure well without complications. It was felt her chest pain was noncardiac and recommendations were made for follow up with her primary care provider. Labs revealed elevated triglycerides for which it is recommended she follow up with her primary care provider. It was also noted her oxygen saturations dropped to ~87% on room air while sleeping. It is recommended she discuss this with her primary care provider to determine if she would benefit from an outpatient sleep study to assess for obstructive sleep apnea. She had no further chest pain and was able to ambulate without difficulty.  She was seen  and evaluated by Dr. Excell Seltzer who felt he was stable for discharge home with plans for follow up as scheduled  below.  Discharge Vitals: Blood pressure 98/42, pulse 67, temperature 98.8 F (37.1 C), temperature source Oral, resp. rate 17, height 5\' 3"  (1.6 m), weight 297 lb 9.9 oz (135 kg), SpO2 100.00%.  Labs: LABORATORY DATA from [facility]: Glucose 106, BUN 15, creatinine 0.6. Sodium 139, potassium 3.1, chloride 105, bicarb 26. Total CK 45, CK-MB 1.0, troponin I less than 0.01. BNP is 18. WBC 7000, hemoglobin 13.7, hematocrit 41.3, platelets 207,000  Component Value Date   CHOL 157 12/13/2011   HDL 33* 12/13/2011   LDLCALC 83 12/13/2011   TRIG 206* 12/13/2011     Ref. Range 12/12/2011 19:40  TSH Latest Range: 0.350-4.500 uIU/mL 1.518    Discharge Medications   Medication List  As of 12/13/2011 10:31 AM   TAKE these medications         acetaminophen 500 MG tablet   Commonly known as: TYLENOL   Take 1,000 mg by mouth every 6 (six) hours as needed. pain      ALPRAZolam 1 MG tablet   Commonly known as: XANAX   Take 1 mg by mouth 3 (three) times daily as needed. For anxiety      escitalopram 20 MG tablet   Commonly known as: LEXAPRO   Take 1 tablet (20 mg total) by mouth daily.      ibuprofen 200 MG tablet   Commonly known as: ADVIL,MOTRIN   Take 800 mg by mouth every 6 (six) hours as needed. pain      metFORMIN 1000 MG tablet   Commonly known as: GLUCOPHAGE   Take 1 tablet (1,000 mg total) by mouth 2 (two) times daily. Please hold for 48hrs after your heart catheterization. You may resume on 12/15/11            Disposition   Discharge Orders    Future Orders Please Complete By Expires   Diet - low sodium heart healthy      Increase activity slowly      Discharge instructions      Comments:   * KEEP WRIST CATHETERIZATION SITE CLEAN AND DRY. Call the office for any signs of bleeding, pus, swelling, increased pain, or any other concerns. * NO HEAVY LIFTING (>10lbs) OR SEXUAL ACTIVITY X 7 DAYS. * NO DRIVING X 2-3 DAYS. * NO SOAKING BATHS, HOT TUBS, POOLS, ETC., X 7  DAYS.  * You heart catheterization was normal and did not show anything that would suggest your chest pain is coming from your heart. Please follow up with your primary care provider.  * Please hold your Metformin for 48hrs after your heart catheterization. You may resume on 12/15/11  * Your triglyceride level was elevated and will need to be followed up with your primary care provider.  * It was noted your oxygen level dropped slightly while you were sleeping. Please discuss this with your primary care provider to determine if you need a sleep study to assess for obstructive sleep apnea.     Follow-up Information    Schedule an appointment as soon as possible for a visit with [facility], MD.   Contact information:   [address]  [place] [address] [phone]           Outstanding Labs/Studies:  None  Duration of Discharge Encounter: Greater than 30 minutes including physician and PA time.  Signed, Ryelee Albee PA-C 12/13/2011, 10:31 AM

## 2011-12-13 NOTE — Progress Notes (Signed)
Utilization Review Completed.Natalie Walker T7/31/2013

## 2011-12-14 NOTE — Discharge Summary (Signed)
Agree as outlined. See my progress note the same date 

## 2011-12-18 DIAGNOSIS — R079 Chest pain, unspecified: Secondary | ICD-10-CM

## 2012-01-02 ENCOUNTER — Ambulatory Visit (INDEPENDENT_AMBULATORY_CARE_PROVIDER_SITE_OTHER): Payer: PRIVATE HEALTH INSURANCE | Admitting: Psychiatry

## 2012-01-02 ENCOUNTER — Encounter (HOSPITAL_COMMUNITY): Payer: Self-pay | Admitting: Psychiatry

## 2012-01-02 VITALS — Wt 297.0 lb

## 2012-01-02 DIAGNOSIS — F331 Major depressive disorder, recurrent, moderate: Secondary | ICD-10-CM

## 2012-01-02 DIAGNOSIS — F329 Major depressive disorder, single episode, unspecified: Secondary | ICD-10-CM

## 2012-01-02 MED ORDER — ALPRAZOLAM 1 MG PO TABS: 1.0000 mg | ORAL_TABLET | Freq: Three times a day (TID) | ORAL | Status: DC | PRN

## 2012-01-02 MED ORDER — ESCITALOPRAM OXALATE 20 MG PO TABS: 20.0000 mg | ORAL_TABLET | Freq: Every day | ORAL | Status: DC

## 2012-01-02 NOTE — Progress Notes (Signed)
Chief complaint I liked Lexapro.      History of presenting illness Patient is 41 year old Caucasian married employed female who came for her followup appointment.  She is taking Lexapro 20 mg along with Xanax 1 mg 3 times a day.  She stopped Prozac which she was taking for past 10 years.  She feel much lax calm and better.  She denies any recent agitation anger mood swing.  She denies any crying spells.  She still very busy with her children but now she is handling them better.  She's also had her job better.  She denies any recent issues at work .  She sleeping better.  She generally side effects of medication.  She continues to smoke however she is watching her diet and able to maintain her weight from her last visit.  She's not drinking or using any illegal substance.    Current psychiatric medication Xanax 1 mg 3 times a day Lexapro 20 mg daily  Past psychiatric history Patient has a previous history of psychiatric inpatient treatment or suicidal attempt.  She denies a history of psychosis paranoia violence.  In the past she has taken Wellbutrin to stop smoking however it has given significant headache.  She had a good response with Abilify however she gained weight with Abilify.  She is been taking Prozac for many years.  Psychosocial history Patient lives with her husband and children.  She is working as a Engineer, civil (consulting) in [employer].  Medical history Patient has significant obesity, polycystic ovarian disease and chronic pain. She takes metformin for weight loss.  Mental status examination Patient is casually dressed and fairly groomed. She appears cooperative and pleasant.  She described her mood is good and her affect is mood appropriate.  She denies any auditory or visual hallucination.  She denies any active or passive suicidal thoughts or homicidal thoughts.  There were no flight of ideas or loose association.  She maintained good eye contact.  There were no paranoia or delusion or  psychotic symptoms present.  She's alert and oriented x3.  Her attention and concentration is improved from the past.  Her insight judgment and pulse control is okay.  Assessment Axis I Major depressive disorder Axis II deferred Axis III see medical history Axis IV mild to moderate Axis V 70 - 75  Plan I review her chart, last progress note and current medication.  She is doing better on Lexapro and Xanax.  Her weight remains unchanged.  I encourage her to stop smoking and do regular exercise and watching her calorie intake.  I will continue her current psychiatric medication.  I recommend to call us if she is any question or concern about the medication she feels worsening of the symptoms.  I will see her again in 3 months.  Time spent 30 minutes.  Portion of this note is generated with voice recognition software and may contain typographical error.

## 2012-01-16 ENCOUNTER — Telehealth (HOSPITAL_COMMUNITY): Payer: Self-pay | Admitting: *Deleted

## 2012-01-16 ENCOUNTER — Other Ambulatory Visit (HOSPITAL_COMMUNITY): Payer: Self-pay | Admitting: Psychiatry

## 2012-01-16 DIAGNOSIS — F331 Major depressive disorder, recurrent, moderate: Secondary | ICD-10-CM

## 2012-01-16 MED ORDER — ALPRAZOLAM 1 MG PO TABS: 1.0000 mg | ORAL_TABLET | Freq: Three times a day (TID) | ORAL | Status: DC | PRN

## 2012-01-16 NOTE — Telephone Encounter (Signed)
Patient called and told that prescription was given for only 30 tablets .  She takes Xanax 1 mg 3 times a day.  A new prescription of Xanax for 90 tablets given.

## 2012-03-22 ENCOUNTER — Ambulatory Visit (HOSPITAL_COMMUNITY): Payer: Self-pay | Admitting: Psychiatry

## 2012-03-22 ENCOUNTER — Encounter (HOSPITAL_COMMUNITY): Payer: Self-pay | Admitting: Psychiatry

## 2012-03-22 ENCOUNTER — Ambulatory Visit (INDEPENDENT_AMBULATORY_CARE_PROVIDER_SITE_OTHER): Payer: PRIVATE HEALTH INSURANCE | Admitting: Psychiatry

## 2012-03-22 VITALS — BP 118/78 | HR 60 | Ht 62.75 in | Wt 291.2 lb

## 2012-03-22 DIAGNOSIS — F419 Anxiety disorder, unspecified: Secondary | ICD-10-CM

## 2012-03-22 DIAGNOSIS — F331 Major depressive disorder, recurrent, moderate: Secondary | ICD-10-CM

## 2012-03-22 DIAGNOSIS — F411 Generalized anxiety disorder: Secondary | ICD-10-CM

## 2012-03-22 DIAGNOSIS — F329 Major depressive disorder, single episode, unspecified: Secondary | ICD-10-CM

## 2012-03-22 DIAGNOSIS — F17200 Nicotine dependence, unspecified, uncomplicated: Secondary | ICD-10-CM

## 2012-03-22 MED ORDER — ALPRAZOLAM 1 MG PO TABS: 1.0000 mg | ORAL_TABLET | Freq: Three times a day (TID) | ORAL | Status: DC | PRN

## 2012-03-22 MED ORDER — NICOTINE 7 MG/24HR TD PT24: 1.0000 | MEDICATED_PATCH | TRANSDERMAL | Status: DC

## 2012-03-22 MED ORDER — ESCITALOPRAM OXALATE 20 MG PO TABS: 20.0000 mg | ORAL_TABLET | Freq: Every day | ORAL | Status: DC

## 2012-03-22 NOTE — Patient Instructions (Addendum)
For smoking cessation, should continue 7 mg daily for 3-5 weeks.    Call the Smoking Cessation hotline at  226-723-2689  for support.    REMEMBER these cost the about the same as a pack of cigarettes a day, but when you stop, you have $5 a day more to spend on yourself and your health.  Plan for a vacation next June with the money you are putting in the bank instead of the store for cigs.

## 2012-03-22 NOTE — Progress Notes (Signed)
Chief complaint I really liked Lexapro.  I see a huge difference.  They are going to start charging [place] $60 more a month if we smoke.  I am ready to stop smoking.    History of presenting illness Patient is 41 year old Caucasian married employed female who came for her followup appointment. Lexapro is helping a lot.  She is using Xanax 3 times a day.  She wants to stop smoking.  Will perescribe Nicoderm and give her the phone number for support.   Will approach the Xanax effect on the brain after she is off cigarettes or has resigned to staying on nicotine.  Gave her a stickie of "I am in charge"  Will commission her to be in charge of her life.   Current psychiatric medication Xanax 1 mg 3 times a day Lexapro 20 mg daily  Past psychiatric history Patient has a previous history of psychiatric inpatient treatment or suicidal attempt.  She denies a history of psychosis paranoia violence.  In the past she has taken Wellbutrin to stop smoking however it has given significant headache.  She had a good response with Abilify however she gained weight with Abilify.  She is been taking Prozac for many years.  Psychosocial history Patient lives with her husband and children.  She is working as a Engineer, civil (consulting) in [employer].  Medical history Patient has significant obesity, polycystic ovarian disease and chronic pain. She takes metformin for weight loss.  Mental status examination Patient is casually dressed and fairly groomed. She appears cooperative and pleasant.  She described her mood is good and her affect is mood appropriate.  She denies any auditory or visual hallucination.  She denies any active or passive suicidal thoughts or homicidal thoughts.  There were no flight of ideas or loose association.  She maintained good eye contact.  There were no paranoia or delusion or psychotic symptoms present.  She's alert and oriented x3.  Her attention and concentration is improved from the past.  Her insight  judgment and pulse control is okay.  Assessment Axis I Major depressive disorder, Anxiety, Nicotine Dependence Axis II deferred Axis III see medical history Axis IV mild to moderate Axis V 70 - 75  Plan I review her chart, last progress note and current medication as well as history and allergies.  Marland Kitchen

## 2012-04-02 ENCOUNTER — Ambulatory Visit (HOSPITAL_COMMUNITY): Payer: Self-pay | Admitting: Psychiatry

## 2012-04-05 ENCOUNTER — Emergency Department (HOSPITAL_COMMUNITY): Payer: PRIVATE HEALTH INSURANCE

## 2012-04-05 ENCOUNTER — Encounter (HOSPITAL_COMMUNITY): Payer: Self-pay | Admitting: *Deleted

## 2012-04-05 ENCOUNTER — Emergency Department (HOSPITAL_COMMUNITY)
Admission: EM | Admit: 2012-04-05 | Discharge: 2012-04-05 | Disposition: A | Discharge status: Home / Self Care | Payer: PRIVATE HEALTH INSURANCE | Attending: Emergency Medicine | Admitting: Emergency Medicine

## 2012-04-05 DIAGNOSIS — E669 Obesity, unspecified: Secondary | ICD-10-CM | POA: Insufficient documentation

## 2012-04-05 DIAGNOSIS — J45909 Unspecified asthma, uncomplicated: Secondary | ICD-10-CM | POA: Insufficient documentation

## 2012-04-05 DIAGNOSIS — E781 Pure hyperglyceridemia: Secondary | ICD-10-CM | POA: Insufficient documentation

## 2012-04-05 DIAGNOSIS — R079 Chest pain, unspecified: Secondary | ICD-10-CM | POA: Insufficient documentation

## 2012-04-05 DIAGNOSIS — F17200 Nicotine dependence, unspecified, uncomplicated: Secondary | ICD-10-CM | POA: Insufficient documentation

## 2012-04-05 DIAGNOSIS — Z8742 Personal history of other diseases of the female genital tract: Secondary | ICD-10-CM | POA: Insufficient documentation

## 2012-04-05 DIAGNOSIS — F3289 Other specified depressive episodes: Secondary | ICD-10-CM | POA: Insufficient documentation

## 2012-04-05 DIAGNOSIS — F329 Major depressive disorder, single episode, unspecified: Secondary | ICD-10-CM | POA: Insufficient documentation

## 2012-04-05 DIAGNOSIS — F411 Generalized anxiety disorder: Secondary | ICD-10-CM | POA: Insufficient documentation

## 2012-04-05 LAB — COMPREHENSIVE METABOLIC PANEL
AST: 16 U/L (ref 0–37)
CO2: 24 mEq/L (ref 19–32)
Calcium: 9.8 mg/dL (ref 8.4–10.5)
Creatinine, Ser: 0.62 mg/dL (ref 0.50–1.10)
GFR calc Af Amer: >90 mL/min (ref >90)
GFR calc non Af Amer: >90 mL/min (ref >90)
Glucose, Bld: 90 mg/dL (ref 70–99)

## 2012-04-05 LAB — CBC
Hemoglobin: 15.1 g/dL — ABNORMAL HIGH (ref 12.0–15.0)
RBC: 5.11 MIL/uL (ref 3.87–5.11)
WBC: 10.2 10*3/uL (ref 4.0–10.5)

## 2012-04-05 LAB — TROPONIN I: Troponin I: <0.3 ng/mL (ref <0.30)

## 2012-04-05 LAB — D-DIMER, QUANTITATIVE: D-Dimer, Quant: 0.64 ug/mL-FEU — ABNORMAL HIGH (ref 0.00–0.48)

## 2012-04-05 MED ORDER — IOHEXOL 350 MG/ML SOLN
100.0000 mL | Freq: Once | INTRAVENOUS | Status: AC | PRN
  Administered 2012-04-05: 100 mL via INTRAVENOUS

## 2012-04-05 MED ORDER — IPRATROPIUM BROMIDE 0.02 % IN SOLN
0.5000 mg | Freq: Once | RESPIRATORY_TRACT | Status: AC
  Administered 2012-04-05: 0.5 mg via RESPIRATORY_TRACT
  Filled 2012-04-05: qty 2.5

## 2012-04-05 MED ORDER — ALBUTEROL SULFATE (5 MG/ML) 0.5% IN NEBU
2.5000 mg | INHALATION_SOLUTION | Freq: Once | RESPIRATORY_TRACT | Status: AC
  Administered 2012-04-05: 2.5 mg via RESPIRATORY_TRACT
  Filled 2012-04-05: qty 0.5

## 2012-04-05 MED ORDER — MORPHINE SULFATE 4 MG/ML IJ SOLN
4.0000 mg | Freq: Once | INTRAMUSCULAR | Status: AC
  Administered 2012-04-05: 4 mg via INTRAVENOUS
  Filled 2012-04-05: qty 1

## 2012-04-05 MED ORDER — LORAZEPAM 2 MG/ML IJ SOLN
1.0000 mg | Freq: Once | INTRAMUSCULAR | Status: AC
  Administered 2012-04-05: 1 mg via INTRAVENOUS
  Filled 2012-04-05: qty 1

## 2012-04-05 NOTE — ED Notes (Signed)
Discharge instructions reviewed with pt, questions answered. Pt verbalized understanding.  

## 2012-04-05 NOTE — ED Notes (Signed)
Chest pain since 1pm.  With sob and pain  In lt arm.  Alert,

## 2012-04-05 NOTE — ED Provider Notes (Signed)
History     CSN: [id number]  Arrival date & time 04/05/12  1657   First MD Initiated Contact with Patient 04/05/12 1739      Chief Complaint  Patient presents with  . Chest Pain    (Consider location/radiation/quality/duration/timing/severity/associated sxs/prior treatment) Patient is a 41 y.o. female presenting with chest pain. The history is provided by the patient.  Chest Pain   She started having anterior chest pain at about 1 PM. This was essentially at rest. Pain did radiate to the left arm and was associated with dyspnea, nausea, diaphoresis. She thought it might be a panic attack she took a dose of Xanax with no relief. Nothing made the pain better nothing made it worse. She rates the pain at 8/10. She had been admitted to the hospital for evaluation of a similar pain last summer. She does smoke one half pack cigarettes a day and has a history of diabetes and hyperlipidemia, but no history of hypertension.  Past Medical History  Diagnosis Date  . Polycystic ovarian disease   . Asthma   . Anxiety   . Depression   . Chest pain     NO CAD by cath 12/12/11, nl LV fxn  . Hypertriglyceridemia   . Tobacco abuse   . Obesity     Past Surgical History  Procedure Date  . Cesarean section   . Ectopic pregnancy surgery   . Cholecystectomy   . Dilation and curettage of uterus   . Cardiac catheterization   . Tubal ligation     History reviewed. No pertinent family history.  History  Substance Use Topics  . Smoking status: Current Every Day Smoker -- 0.5 packs/day    Types: Cigarettes  . Smokeless tobacco: Not on file  . Alcohol Use: No    OB History    Grav Para Term Preterm Abortions TAB SAB Ect Mult Living                  Review of Systems  Unable to perform ROS Cardiovascular: Positive for chest pain.  All other systems reviewed and are negative.    Allergies  Review of patient's allergies indicates no known allergies.  Home Medications   Current  Outpatient Rx  Name  Route  Sig  Dispense  Refill  . ACETAMINOPHEN 500 MG PO TABS   Oral   Take 1,000 mg by mouth every 6 (six) hours as needed. pain         . ALPRAZOLAM 1 MG PO TABS   Oral   Take 1 tablet (1 mg total) by mouth 3 (three) times daily as needed for anxiety. For anxiety   90 tablet   2   . ESCITALOPRAM OXALATE 20 MG PO TABS   Oral   Take 1 tablet (20 mg total) by mouth daily.   30 tablet   2   . IBUPROFEN 200 MG PO TABS   Oral   Take 400-800 mg by mouth every 6 (six) hours as needed. pain         . METFORMIN HCL 1000 MG PO TABS   Oral   Take 1 tablet (1,000 mg total) by mouth 2 (two) times daily. Please hold for 48hrs after your heart catheterization. You may resume on 12/15/11         . NICOTINE 14 MG/24HR TD PT24   Transdermal   Place 1 patch onto the skin daily.           BP 120/72  Pulse 88  Temp 99.5 F (37.5 C) (Oral)  Resp 20  Ht 5\' 2"  (1.575 m)  Wt 286 lb (129.729 kg)  BMI 52.31 kg/m2  SpO2 100%  LMP 03/08/2012  Physical Exam  Nursing note and vitals reviewed. 41 year old female, resting comfortably and in no acute distress. Vital signs are normal. Oxygen saturation is 100%, which is normal. Head is normocephalic and atraumatic. PERRLA, EOMI. Oropharynx is clear. Neck is nontender and supple without adenopathy or JVD. Back is nontender and there is no CVA tenderness. Lungs are clear without rales, wheezes, or rhonchi. However, with forced exhalation, there is slight wheezing noted in all lung fields. Chest is nontender. Heart has regular rate and rhythm without murmur. Abdomen is soft, flat, nontender without masses or hepatosplenomegaly and peristalsis is normoactive. Extremities have no cyanosis or edema, full range of motion is present. Skin is warm and dry without rash. Neurologic: Mental status is normal, cranial nerves are intact, there are no motor or sensory deficits.   ED Course  Procedures (including critical care  time)  Results for orders placed during the hospital encounter of 04/05/12  COMPREHENSIVE METABOLIC PANEL      Component Value Range   Sodium 137  135 - 145 mEq/L   Potassium 3.6  3.5 - 5.1 mEq/L   Chloride 102  96 - 112 mEq/L   CO2 24  19 - 32 mEq/L   Glucose, Bld 90  70 - 99 mg/dL   BUN 11  6 - 23 mg/dL   Creatinine, Ser 1.61  0.50 - 1.10 mg/dL   Calcium 9.8  8.4 - 09.6 mg/dL   Total Protein 7.2  6.0 - 8.3 g/dL   Albumin 3.7  3.5 - 5.2 g/dL   AST 16  0 - 37 U/L   ALT 17  0 - 35 U/L   Alkaline Phosphatase 116  39 - 117 U/L   Total Bilirubin 0.3  0.3 - 1.2 mg/dL   GFR calc non Af Amer >90  >90 mL/min   GFR calc Af Amer >90  >90 mL/min  CBC      Component Value Range   WBC 10.2  4.0 - 10.5 K/uL   RBC 5.11  3.87 - 5.11 MIL/uL   Hemoglobin 15.1 (*) 12.0 - 15.0 g/dL   HCT 04.5  40.9 - 81.1 %   MCV 85.5  78.0 - 100.0 fL   MCH 29.5  26.0 - 34.0 pg   MCHC 34.6  30.0 - 36.0 g/dL   RDW 91.4  78.2 - 95.6 %   Platelets 201  150 - 400 K/uL  TROPONIN I      Component Value Range   Troponin I <0.30  <0.30 ng/mL  D-DIMER, QUANTITATIVE      Component Value Range   D-Dimer, Quant 0.64 (*) 0.00 - 0.48 ug/mL-FEU   Ct Angio Chest W/cm &/or Wo Cm  04/05/2012  *RADIOLOGY REPORT*  Clinical Data: Chest pain, shortness of breath  CT ANGIOGRAPHY CHEST  Technique:  Multidetector CT imaging of the chest using the standard protocol during bolus administration of intravenous contrast. Multiplanar reconstructed images including MIPs were obtained and reviewed to evaluate the vascular anatomy.  Contrast: OMNIPAQUE IOHEXOL 350 MG/ML SOLN  Comparison: 06/23/2011  Findings: There is good contrast opacification of the pulmonary artery branches.  No discrete filling defect to suggest acute PE.There is an aberrant right subclavian artery Which traverses posterior to the esophagus. Adequate contrast opacification of the thoracic aorta  with no evidence of dissection, aneurysm, or stenosis. No Pleural or  pericardial effusion.  No hilar or mediastinal adenopathy. Lungs clear.  Visualized upper abdomen unremarkable.  Minimal spondylitic changes in the lower thoracic spine.  IMPRESSION:  1.  Negative for acute PE or thoracic aortic dissection.   Original Report Authenticated By: D. Andria Rhein, MD    Dg Chest Portable 1 View  04/05/2012  *RADIOLOGY REPORT*  Clinical Data: Chest pain.  PORTABLE CHEST - 1 VIEW  Comparison: 03/26/2012  Findings: Heart size upper limits normal for technique.  Lungs are clear.  No effusion.  Visualized bones unremarkable.  IMPRESSION:  No acute disease   Original Report Authenticated By: D. Hassell III, MD      ECG shows normal sinus rhythm with a rate of 86, no ectopy. Normal axis. Normal P wave. Normal QRS. Normal intervals. Normal ST and T waves. Impression: normal ECG. No prior ECG available for comparison.   1. Chest pain       MDM  Chest pain which is unlikely to be of cardiac origin. I have reviewed her past records and she had a totally normal cardiac catheterization on July 30 of this year. There is slight wheezing noted to on forced exhalation, so her symptoms may be related to bronchospasm and she'll be given a therapeutic trial of albuterol with Atrovent. D-dimer will be checked to make sure she's not having pulmonary emboli.  D-dimer is come back somewhat elevated so CT angiogram is obtained which is normal. She was given a dose of morphine with no improvement. This was followed by a dose of morphine and lorazepam with good relief of symptoms. She is discharged with reassurance that there is no evidence of any serious pathology behind her symptoms.      Dione Booze, MD 04/05/12 2158

## 2012-04-07 ENCOUNTER — Observation Stay (HOSPITAL_COMMUNITY)
Admission: EM | Admit: 2012-04-07 | Discharge: 2012-04-08 | Disposition: A | Discharge status: Home / Self Care | Payer: PRIVATE HEALTH INSURANCE | Attending: Internal Medicine | Admitting: Internal Medicine

## 2012-04-07 ENCOUNTER — Encounter (HOSPITAL_COMMUNITY): Payer: Self-pay | Admitting: *Deleted

## 2012-04-07 ENCOUNTER — Emergency Department (HOSPITAL_COMMUNITY): Payer: PRIVATE HEALTH INSURANCE

## 2012-04-07 DIAGNOSIS — J45909 Unspecified asthma, uncomplicated: Secondary | ICD-10-CM | POA: Insufficient documentation

## 2012-04-07 DIAGNOSIS — F329 Major depressive disorder, single episode, unspecified: Secondary | ICD-10-CM | POA: Insufficient documentation

## 2012-04-07 DIAGNOSIS — Z23 Encounter for immunization: Secondary | ICD-10-CM | POA: Insufficient documentation

## 2012-04-07 DIAGNOSIS — F331 Major depressive disorder, recurrent, moderate: Secondary | ICD-10-CM

## 2012-04-07 DIAGNOSIS — F17200 Nicotine dependence, unspecified, uncomplicated: Secondary | ICD-10-CM

## 2012-04-07 DIAGNOSIS — F411 Generalized anxiety disorder: Secondary | ICD-10-CM | POA: Insufficient documentation

## 2012-04-07 DIAGNOSIS — R072 Precordial pain: Secondary | ICD-10-CM

## 2012-04-07 DIAGNOSIS — I498 Other specified cardiac arrhythmias: Secondary | ICD-10-CM | POA: Insufficient documentation

## 2012-04-07 DIAGNOSIS — F419 Anxiety disorder, unspecified: Secondary | ICD-10-CM

## 2012-04-07 DIAGNOSIS — R55 Syncope and collapse: Principal | ICD-10-CM | POA: Insufficient documentation

## 2012-04-07 LAB — BASIC METABOLIC PANEL
Chloride: 103 mEq/L (ref 96–112)
GFR calc Af Amer: >90 mL/min (ref >90)
GFR calc non Af Amer: 83 mL/min — ABNORMAL LOW (ref >90)
Potassium: 3.8 mEq/L (ref 3.5–5.1)
Sodium: 137 mEq/L (ref 135–145)

## 2012-04-07 LAB — CBC WITH DIFFERENTIAL/PLATELET
Basophils Relative: 0 % (ref 0–1)
Eosinophils Absolute: 0.2 10*3/uL (ref 0.0–0.7)
Hemoglobin: 14.2 g/dL (ref 12.0–15.0)
Lymphs Abs: 1.9 10*3/uL (ref 0.7–4.0)
MCH: 29.6 pg (ref 26.0–34.0)
MCHC: 34.3 g/dL (ref 30.0–36.0)
Neutro Abs: 6.2 10*3/uL (ref 1.7–7.7)
Neutrophils Relative %: 70 % (ref 43–77)
Platelets: 198 10*3/uL (ref 150–400)
RBC: 4.79 MIL/uL (ref 3.87–5.11)

## 2012-04-07 LAB — URINALYSIS, ROUTINE W REFLEX MICROSCOPIC
Leukocytes, UA: NEGATIVE
Nitrite: NEGATIVE
Protein, ur: NEGATIVE mg/dL
Specific Gravity, Urine: 1.025 (ref 1.005–1.030)
Urobilinogen, UA: 0.2 mg/dL (ref 0.0–1.0)

## 2012-04-07 LAB — D-DIMER, QUANTITATIVE: D-Dimer, Quant: 0.51 ug/mL-FEU — ABNORMAL HIGH (ref 0.00–0.48)

## 2012-04-07 LAB — URINE MICROSCOPIC-ADD ON

## 2012-04-07 LAB — RAPID URINE DRUG SCREEN, HOSP PERFORMED: Opiates: NOT DETECTED

## 2012-04-07 MED ORDER — ONDANSETRON HCL 4 MG/2ML IJ SOLN
4.0000 mg | Freq: Once | INTRAMUSCULAR | Status: AC
  Administered 2012-04-07: 4 mg via INTRAVENOUS
  Filled 2012-04-07: qty 2

## 2012-04-07 MED ORDER — KETOROLAC TROMETHAMINE 30 MG/ML IJ SOLN
30.0000 mg | Freq: Once | INTRAMUSCULAR | Status: AC
  Administered 2012-04-07: 30 mg via INTRAVENOUS
  Filled 2012-04-07: qty 1

## 2012-04-07 MED ORDER — MORPHINE SULFATE 4 MG/ML IJ SOLN
4.0000 mg | Freq: Once | INTRAMUSCULAR | Status: AC
  Administered 2012-04-07: 4 mg via INTRAVENOUS
  Filled 2012-04-07: qty 1

## 2012-04-07 NOTE — ED Notes (Addendum)
Pt ambulated independently to bathroom with no difficulty.

## 2012-04-07 NOTE — ED Notes (Addendum)
Was notified by Fannie Knee, NT that pt has passed out.  Per NT, she was standing outside of the rest room waiting on pt to finish so she could be walked back to her room.  NT knocked on door and received no response, so she opened the door to discover pt laying prone on floor. Pt aroused to stimulation, and was immediately alert and oriented.  Pt stood and sat in wheelchair with minimal assistance.  Returned to room. VS taken. IV started.  Pt instructed to not get out of bed without assistance.  Dr. Adriana Simas at bedside for evaluation.

## 2012-04-07 NOTE — ED Notes (Signed)
Pt reporting headache and now c/o nausea.  No vomiting at present.  Denies dizziness at present.  Lungs clear, NSR on monitor.  No signs of distress noted at this time.

## 2012-04-07 NOTE — ED Notes (Signed)
Pt continues to c/o same headache that brought her into department, no relief from medications, denies worsening.  Pt states she is "ready to go home."  Discussed with pt risks of leaving AMA and encouraged remaining to stay in department till hospitalist was able to evaluate. Pt reports she is "just frustrated about being here."  Dr. Adriana Simas aware, and at bedside to discuss plan with pt.

## 2012-04-07 NOTE — ED Notes (Signed)
Pt reports syncopal episode today while at church.  States went to [place], waited and left due to long wait.  Went home took motrin for headache, laid down, woke up, and had another syncopal episode.  Still c/o headache.  C/o nausea.  Denies dizziness.  Pt alert and oriented x 4 in triage.

## 2012-04-07 NOTE — ED Provider Notes (Signed)
History  This chart was scribed for Natalie Hutching, MD by Erskine Emery, ED Scribe. This patient was seen in room APA02/APA02 and the patient's care was started at 19:44.   CSN: [id number]  Arrival date & time 04/07/12  4098   First MD Initiated Contact with Patient 04/07/12 1944      Chief Complaint  Patient presents with  . syncopal episode     (Consider location/radiation/quality/duration/timing/severity/associated sxs/prior treatment) The history is provided by the patient. No language interpreter was used.  Natalie Walker is a 41 y.o. female who presents to the Emergency Department complaining of several episodes of syncope today since waking. Pt had an episode of syncope at church this morning and woke up to EMS bringing her to [place]. Pt got tired of waiting at [facility] so she left and went home. A few hours later she passed out in the kitchen and again woke to EMS over her, preparing to bring her here. Pt reports she had a fever, a severe headache, and nausea upon waking. Pt denies any associated vaginal or rectal bleeding. There is no chance she would be pregnant, she has a h/o bilateral tubal ligation. Pt also has a h/o plycystic ovarian disease (for which she takes metformin), depression, and anxiety (for which she takes xanax). Pt reports she has had anxiety attacks in the past but never syncope until about a month ago when she came here after an episode of syncope. Pt was also seen here Friday night for chest pain. They did a CT and ruled out a blood clot, then sent her home. Pt also had a cardiac catheterization work up in July of 2013 that was normal. Pt works as a Lawyer at [employer].  Dr. Sherryll Burger in [place] is the pt's PCP.  Past Medical History  Diagnosis Date  . Polycystic ovarian disease   . Asthma   . Anxiety   . Depression   . Chest pain     NO CAD by cath 12/12/11, nl LV fxn  . Hypertriglyceridemia   . Tobacco abuse   . Obesity     Past Surgical History  Procedure Date  .  Cesarean section   . Ectopic pregnancy surgery   . Cholecystectomy   . Dilation and curettage of uterus   . Cardiac catheterization   . Tubal ligation     No family history on file.  History  Substance Use Topics  . Smoking status: Current Every Day Smoker -- 0.5 packs/day    Types: Cigarettes  . Smokeless tobacco: Not on file  . Alcohol Use: No    OB History    Grav Para Term Preterm Abortions TAB SAB Ect Mult Living                  Review of Systems A complete 10 system review of systems was obtained and all systems are negative except as noted in the HPI and PMH.    Allergies  Review of patient's allergies indicates no known allergies.  Home Medications   Current Outpatient Rx  Name  Route  Sig  Dispense  Refill  . ACETAMINOPHEN 500 MG PO TABS   Oral   Take 1,000 mg by mouth every 6 (six) hours as needed. pain         . ALPRAZOLAM 1 MG PO TABS   Oral   Take 1 tablet (1 mg total) by mouth 3 (three) times daily as needed for anxiety. For anxiety   90 tablet  2   . ESCITALOPRAM OXALATE 20 MG PO TABS   Oral   Take 1 tablet (20 mg total) by mouth daily.   30 tablet   2   . IBUPROFEN 200 MG PO TABS   Oral   Take 400-800 mg by mouth every 6 (six) hours as needed. pain         . METFORMIN HCL 1000 MG PO TABS   Oral   Take 1 tablet (1,000 mg total) by mouth 2 (two) times daily. Please hold for 48hrs after your heart catheterization. You may resume on 12/15/11         . NICOTINE 14 MG/24HR TD PT24   Transdermal   Place 1 patch onto the skin daily.           Triage Vitals: BP 128/68  Pulse 82  Temp 97.8 F (36.6 C) (Oral)  Resp 20  Ht 5\' 2"  (1.575 m)  Wt 286 lb (129.729 kg)  BMI 52.31 kg/m2  SpO2 99%  LMP 03/08/2012  Physical Exam  Nursing note and vitals reviewed. Constitutional: She is oriented to person, place, and time. She appears well-developed and well-nourished.       Obese.  HENT:  Head: Normocephalic and atraumatic.        Tender to forehead, where she fell.  Eyes: Conjunctivae normal and EOM are normal. Pupils are equal, round, and reactive to light.  Neck: Normal range of motion. Neck supple.  Cardiovascular: Regular rhythm and normal heart sounds.  Bradycardia present.        Slightly hypotensive. Slightly bradycardic.  Pulmonary/Chest: Effort normal and breath sounds normal.  Abdominal: Soft. Bowel sounds are normal.  Musculoskeletal: Normal range of motion.  Neurological: She is alert and oriented to person, place, and time.  Skin: Skin is warm and dry.  Psychiatric: She has a normal mood and affect.    ED Course  Procedures (including critical care time) DIAGNOSTIC STUDIES: Oxygen Saturation is 99% on room air, normal by my interpretation.    COORDINATION OF CARE: 19:50--We found the pt on the floor of the bathroom. Pt denies being conscious of her fall.  20:02--I evaluated the patient and we discussed a treatment plan including blood work, head CT, and possible admission to the hospital to which the pt and her husband agreed.   21:52-21:54--Consult with the hospitalist to admit the pt.    Labs Reviewed  BASIC METABOLIC PANEL - Abnormal; Notable for the following:    GFR calc non Af Amer 83 (*)     All other components within normal limits  URINALYSIS, ROUTINE W REFLEX MICROSCOPIC - Abnormal; Notable for the following:    Hgb urine dipstick TRACE (*)     All other components within normal limits  URINE RAPID DRUG SCREEN (HOSP PERFORMED) - Abnormal; Notable for the following:    Benzodiazepines POSITIVE (*)     All other components within normal limits  URINE MICROSCOPIC-ADD ON - Abnormal; Notable for the following:    Squamous Epithelial / LPF FEW (*)     All other components within normal limits  CBC WITH DIFFERENTIAL  PREGNANCY, URINE  D-DIMER, QUANTITATIVE   No results found.   No diagnosis found.  Date: 04/07/2012  Rate: 73  Rhythm: normal sinus rhythm  QRS Axis: normal   Intervals: normal  ST/T Wave abnormalities: normal  Conduction Disutrbances: none  Narrative Interpretation: unremarkable  c SA  Ct Head Wo Contrast  04/07/2012  *RADIOLOGY REPORT*  Clinical Data: Status  post fall; dizziness and weakness.  Frontal headache.  CT HEAD WITHOUT CONTRAST  Technique:  Contiguous axial images were obtained from the base of the skull through the vertex without contrast.  Comparison: CT of the head performed 10/29/2010  Findings: There is no evidence of acute infarction, mass lesion, or intra- or extra-axial hemorrhage on CT.  The posterior fossa, including the cerebellum, brainstem and fourth ventricle, is within normal limits.  The third and lateral ventricles, and basal ganglia are unremarkable in appearance.  The cerebral hemispheres are symmetric in appearance, with normal gray- white differentiation.  No mass effect or midline shift is seen.  There is no evidence of fracture; visualized osseous structures are unremarkable in appearance.  The orbits are within normal limits. The paranasal sinuses and mastoid air cells are well-aerated.  No significant soft tissue abnormalities are seen.  IMPRESSION: No evidence of traumatic intracranial injury or fracture.   Original Report Authenticated By: Tonia Ghent, M.D.     MDM  Unexplained syncope.  Witnessed syncopal spell in the emergency room.  Screening tests normal.  Patient is alert and oriented. No gross neurological deficits. Admit to general medicine.     I personally performed the services described in this documentation, which was scribed in my presence. The recorded information has been reviewed and is accurate.    Natalie Hutching, MD 04/07/12 2159

## 2012-04-08 ENCOUNTER — Encounter (HOSPITAL_COMMUNITY): Payer: Self-pay

## 2012-04-08 ENCOUNTER — Observation Stay (HOSPITAL_COMMUNITY): Payer: PRIVATE HEALTH INSURANCE

## 2012-04-08 DIAGNOSIS — F329 Major depressive disorder, single episode, unspecified: Secondary | ICD-10-CM

## 2012-04-08 DIAGNOSIS — F17200 Nicotine dependence, unspecified, uncomplicated: Secondary | ICD-10-CM

## 2012-04-08 DIAGNOSIS — I517 Cardiomegaly: Secondary | ICD-10-CM

## 2012-04-08 DIAGNOSIS — R55 Syncope and collapse: Secondary | ICD-10-CM

## 2012-04-08 LAB — TSH: TSH: 1.716 u[IU]/mL (ref 0.350–4.500)

## 2012-04-08 LAB — TROPONIN I
Troponin I: <0.3 ng/mL (ref <0.30)
Troponin I: <0.3 ng/mL (ref <0.30)

## 2012-04-08 LAB — LIPID PANEL
Cholesterol: 149 mg/dL (ref 0–200)
VLDL: 28 mg/dL (ref 0–40)

## 2012-04-08 MED ORDER — ESCITALOPRAM OXALATE 10 MG PO TABS
20.0000 mg | ORAL_TABLET | Freq: Every day | ORAL | Status: DC
  Administered 2012-04-08: 20 mg via ORAL
  Filled 2012-04-08: qty 2

## 2012-04-08 MED ORDER — ALPRAZOLAM 1 MG PO TABS
1.0000 mg | ORAL_TABLET | Freq: Three times a day (TID) | ORAL | Status: DC | PRN
  Administered 2012-04-08: 1 mg via ORAL
  Filled 2012-04-08: qty 1

## 2012-04-08 MED ORDER — HYDROCODONE-ACETAMINOPHEN 5-325 MG PO TABS
1.0000 | ORAL_TABLET | Freq: Four times a day (QID) | ORAL | Status: DC | PRN
  Administered 2012-04-08 (×2): 1 via ORAL
  Filled 2012-04-08 (×2): qty 1

## 2012-04-08 MED ORDER — FOLIC ACID 5 MG/ML IJ SOLN
INTRAMUSCULAR | Status: AC
  Filled 2012-04-08: qty 0.2

## 2012-04-08 MED ORDER — ALUM & MAG HYDROXIDE-SIMETH 200-200-20 MG/5ML PO SUSP: 30.0000 mL | Freq: Four times a day (QID) | ORAL | Status: DC | PRN

## 2012-04-08 MED ORDER — ONDANSETRON HCL 4 MG PO TABS: 4.0000 mg | ORAL_TABLET | Freq: Four times a day (QID) | ORAL | Status: DC | PRN

## 2012-04-08 MED ORDER — DOCUSATE SODIUM 100 MG PO CAPS
100.0000 mg | ORAL_CAPSULE | Freq: Two times a day (BID) | ORAL | Status: DC
  Administered 2012-04-08: 100 mg via ORAL
  Filled 2012-04-08 (×2): qty 1

## 2012-04-08 MED ORDER — METFORMIN HCL 500 MG PO TABS
1000.0000 mg | ORAL_TABLET | Freq: Two times a day (BID) | ORAL | Status: DC
Start: 2012-04-08 — End: 2012-04-08
  Administered 2012-04-08 (×2): 1000 mg via ORAL
  Filled 2012-04-08 (×2): qty 2

## 2012-04-08 MED ORDER — ONDANSETRON HCL 4 MG/2ML IJ SOLN: 4.0000 mg | Freq: Four times a day (QID) | INTRAMUSCULAR | Status: DC | PRN

## 2012-04-08 MED ORDER — ACETAMINOPHEN 500 MG PO TABS: 1000.0000 mg | ORAL_TABLET | Freq: Four times a day (QID) | ORAL | Status: DC | PRN

## 2012-04-08 MED ORDER — IBUPROFEN 800 MG PO TABS: 400.0000 mg | ORAL_TABLET | Freq: Four times a day (QID) | ORAL | Status: DC | PRN

## 2012-04-08 MED ORDER — NICOTINE 14 MG/24HR TD PT24
14.0000 mg | MEDICATED_PATCH | TRANSDERMAL | Status: DC
  Administered 2012-04-08: 14 mg via TRANSDERMAL
  Filled 2012-04-08: qty 1

## 2012-04-08 MED ORDER — ASPIRIN EC 81 MG PO TBEC
81.0000 mg | DELAYED_RELEASE_TABLET | Freq: Every day | ORAL | Status: DC
  Administered 2012-04-08: 81 mg via ORAL
  Filled 2012-04-08: qty 1

## 2012-04-08 MED ORDER — THIAMINE HCL 100 MG/ML IJ SOLN
Freq: Once | INTRAVENOUS | Status: AC
  Administered 2012-04-08: 02:00:00 via INTRAVENOUS
  Filled 2012-04-08: qty 1000

## 2012-04-08 MED ORDER — THIAMINE HCL 100 MG/ML IJ SOLN
INTRAMUSCULAR | Status: AC
  Filled 2012-04-08: qty 2

## 2012-04-08 MED ORDER — SODIUM CHLORIDE 0.9 % IJ SOLN
3.0000 mL | Freq: Two times a day (BID) | INTRAMUSCULAR | Status: DC
  Administered 2012-04-08: 3 mL via INTRAVENOUS

## 2012-04-08 MED ORDER — M.V.I. ADULT IV INJ
INJECTION | INTRAVENOUS | Status: AC
  Filled 2012-04-08: qty 10

## 2012-04-08 MED ORDER — PNEUMOCOCCAL VAC POLYVALENT 25 MCG/0.5ML IJ INJ
0.5000 mL | INJECTION | INTRAMUSCULAR | Status: AC
  Administered 2012-04-08: 0.5 mL via INTRAMUSCULAR
  Filled 2012-04-08: qty 0.5

## 2012-04-08 NOTE — Progress Notes (Signed)
Discharge instructions and work note given, verbalized understanding, out ambulatory with friend in stable condition.

## 2012-04-08 NOTE — Discharge Summary (Signed)
Physician Discharge Summary  Natalie Walker:[id number] DOB: [birth year] DOA: 04/07/2012  PCP: Kirstie Peri, MD  Admit date: 04/07/2012 Discharge date: 04/08/2012  Time spent: Greater than 30  minutes  Recommendations for Outpatient Follow-up:  1. The patient will followup with her primary care physician in one week. 2. She was advised not to drive for one week, or until she is reevaluated by her primary care physician. 3. She was instructed to avoid dehydration and to stand slowly. 4. TSH and 2-D echocardiogram results pending at the time of discharge.  Discharge Diagnoses:  1. Syncope. No clear etiology elucidated. 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 2. Tobacco abuse/nicotine addiction. She was advised to stop smoking. 3. Borderline bradycardia. 3. Major depressive disorder and chronic anxiety. 4. History of chest pain. Cardiac catheterization in July 2013 revealed no coronary artery disease.   Discharge Condition: Improved and stable.  Diet recommendation: Heart healthy.  Filed Weights   04/07/12 1838 04/08/12 0124  Weight: 129.729 kg (286 lb) 133.5 kg (294 lb 5 oz)    History of present illness:  The patient is a 41 year old woman with a history significant for major depressive disorder, chronic anxiety, and hyperlipidemia, who presented to the emergency department on 04/07/2012 after a report of passing out. She was actually seen at [facility] for the same complaint earlier, but left because of the long wait. She reported passing out while in church. Apparently, she regained consciousness spontaneously. There were no injuries with the collapse as witnessed by the family. No evidence of seizure activity. In the emergency department, she was afebrile and hemodynamically stable, though at times, she was borderline bradycardic. CT scan of her head revealed no evidence of traumatic intracranial injury or fracture. Her urine drug screen was positive for benzodiazepines which she takes  chronically. Her EKG revealed normal sinus rhythm with arrhythmia and a heart rate of 73 beats per minute. Her other lab data were virtually unremarkable with exception of a mildly elevated d-dimer. She was admitted for further evaluation and management.  Hospital Course:  The patient was given IV fluids for 24 hours totaling approximately 1 L. For further evaluation, a number of studies were ordered. All of her cardiac enzymes were within normal limits. Her fasting lipid profile was essentially normal with exception of a borderline low LDL of 36. Her urine pregnancy test was negative. Her carotid ultrasound revealed no ICA stenosis. Bilateral lower extremity venous ultrasound revealed no evidence of deep vein thrombosis. Her followup EKG revealed sinus bradycardia with a heart rate of 58 beats per minute. Orthostatic vital signs revealed no orthostatic changes. The results of her 2-D echocardiogram and TSH are pending at the time of discharge.  The patient was not symptomatic at all during the hospitalization. She ambulated with the nursing staff with no difficulties. The etiology of her syncopal episode was unknown, although, she was noted to have low normal blood pressures. Therefore, orthostatic hypotension in the setting of mild dehydration could possibly be the culprit however this was not confirmed.  Upon discharge, the patient was advised to avoid dehydration, drink plenty of fluids, and to stand slowly when rising. She was also advised to not drive for one week or until she was reevaluated by her primary care physician. She voiced understanding.  Procedures:  2-D echocardiogram: Pending  Consultations:  None  Discharge Exam: Filed Vitals:   04/08/12 1229 04/08/12 1230 04/08/12 1231 04/08/12 1443  BP: 90/59 122/81 129/86 115/74  Pulse:    60  Temp:  98.2 F (36.8 C)  TempSrc:    Oral  Resp:    20  Height:      Weight:      SpO2: 98% 97% 98% 97%    General: Obese 41 year old  Caucasian woman laying in bed, in no acute distress. Cardiovascular: S1, S2, with borderline bradycardia. Respiratory: Clear to auscultation bilaterally. Neurologic: She is alert and oriented x3. Cranial nerves II through XII are intact. Gait is within normal limits. Strength is 5 over 5 throughout. Sensation is intact. No nystagmus. Psychiatric: Flat affect but pleasant. Speech is clear and nonpressured.  Discharge Instructions  Discharge Orders    Future Orders Please Complete By Expires   Diet Carb Modified      Increase activity slowly      Discharge instructions      Comments:   Stay hydrated. Drink at least 6-8 glasses of fluids/liquids daily including juice, water, Gatorade, etc. Stand slowly from sitting or laying. No driving for one week. Stop smoking.   Driving Restrictions      Comments:   No driving for one week or any other time that you have a passing out episode.       Medication List     As of 04/08/2012  3:43 PM    TAKE these medications         acetaminophen 500 MG tablet   Commonly known as: TYLENOL   Take 1,000 mg by mouth every 6 (six) hours as needed. pain      ALPRAZolam 1 MG tablet   Commonly known as: XANAX   Take 1 tablet (1 mg total) by mouth 3 (three) times daily as needed for anxiety. For anxiety      escitalopram 20 MG tablet   Commonly known as: LEXAPRO   Take 1 tablet (20 mg total) by mouth daily.      ibuprofen 200 MG tablet   Commonly known as: ADVIL,MOTRIN   Take 400-800 mg by mouth every 6 (six) hours as needed. pain      metFORMIN 1000 MG tablet   Commonly known as: GLUCOPHAGE   Take 1 tablet (1,000 mg total) by mouth 2 (two) times daily. Please hold for 48hrs after your heart catheterization. You may resume on 12/15/11      nicotine 14 mg/24hr patch   Commonly known as: NICODERM CQ - dosed in mg/24 hours   Place 1 patch onto the skin daily.           Follow-up Information    Follow up with [facility], [place]. On 04/15/2012.  (At 4:15pm)    Contact information:   [address]  [place] [address] [phone]           The results of significant diagnostics from this hospitalization (including imaging, microbiology, ancillary and laboratory) are listed below for reference.    Significant Diagnostic Studies: Ct Head Wo Contrast  04/07/2012  *RADIOLOGY REPORT*  Clinical Data: Status post fall; dizziness and weakness.  Frontal headache.  CT HEAD WITHOUT CONTRAST  Technique:  Contiguous axial images were obtained from the base of the skull through the vertex without contrast.  Comparison: CT of the head performed 10/29/2010  Findings: There is no evidence of acute infarction, mass lesion, or intra- or extra-axial hemorrhage on CT.  The posterior fossa, including the cerebellum, brainstem and fourth ventricle, is within normal limits.  The third and lateral ventricles, and basal ganglia are unremarkable in appearance.  The cerebral hemispheres are symmetric in appearance,  with normal gray- white differentiation.  No mass effect or midline shift is seen.  There is no evidence of fracture; visualized osseous structures are unremarkable in appearance.  The orbits are within normal limits. The paranasal sinuses and mastoid air cells are well-aerated.  No significant soft tissue abnormalities are seen.  IMPRESSION: No evidence of traumatic intracranial injury or fracture.   Original Report Authenticated By: Tonia Ghent, M.D.    Ct Angio Chest W/cm &/or Wo Cm  04/05/2012  *RADIOLOGY REPORT*  Clinical Data: Chest pain, shortness of breath  CT ANGIOGRAPHY CHEST  Technique:  Multidetector CT imaging of the chest using the standard protocol during bolus administration of intravenous contrast. Multiplanar reconstructed images including MIPs were obtained and reviewed to evaluate the vascular anatomy.  Contrast: OMNIPAQUE IOHEXOL 350 MG/ML SOLN  Comparison: 06/23/2011  Findings: There is good contrast opacification of the  pulmonary artery branches.  No discrete filling defect to suggest acute PE.There is an aberrant right subclavian artery Which traverses posterior to the esophagus. Adequate contrast opacification of the thoracic aorta with no evidence of dissection, aneurysm, or stenosis. No Pleural or pericardial effusion.  No hilar or mediastinal adenopathy. Lungs clear.  Visualized upper abdomen unremarkable.  Minimal spondylitic changes in the lower thoracic spine.  IMPRESSION:  1.  Negative for acute PE or thoracic aortic dissection.   Original Report Authenticated By: D. Andria Rhein, MD    US Carotid Duplex Bilateral  04/08/2012  *RADIOLOGY REPORT*  Clinical Data: Syncope.  Tobacco use.  BILATERAL CAROTID DUPLEX ULTRASOUND  Technique: Wallace Cullens scale imaging, color Doppler and duplex ultrasound was performed of bilateral carotid and vertebral arteries in the neck.  Comparison: None available  Criteria:  Quantification of carotid stenosis is based on velocity parameters that correlate the residual internal carotid diameter with NASCET-based stenosis levels, using the diameter of the distal internal carotid lumen as the denominator for stenosis measurement.  The following velocity measurements were obtained:                   PEAK SYSTOLIC/END DIASTOLIC RIGHT ICA:                        71/16cm/sec CCA:                        72/13cm/sec SYSTOLIC ICA/CCA RATIO:     0.99 DIASTOLIC ICA/CCA RATIO:    1.22 ECA:                        77cm/sec  LEFT ICA:                        60/20cm/sec CCA:                        64/14cm/sec SYSTOLIC ICA/CCA RATIO:     0.93 DIASTOLIC ICA/CCA RATIO:    1.4 ECA:                        68cm/sec  Findings:  RIGHT CAROTID ARTERY: No significant plaque accumulation or stenosis.  Normal wave forms and color Doppler signal.  RIGHT VERTEBRAL ARTERY:  Normal flow direction and waveform.  LEFT CAROTID ARTERY: No significant plaque accumulation or stenosis.  Normal wave forms and color Doppler signal.  LEFT  VERTEBRAL ARTERY:  Normal flow direction and waveform.  IMPRESSION:  No significant carotid   plaque or stenosis.   Original Report Authenticated By: D. Andria Rhein, MD    US Venous Img Lower Bilateral  04/08/2012  *RADIOLOGY REPORT*  Clinical Data: Elevated D-dimer.  VENOUS DUPLEX ULTRASOUND OF BILATERAL LOWER EXTREMITIES  Technique:  Gray-scale sonography with graded compression, as well as color Doppler and duplex ultrasound, were performed to evaluate the deep venous system of both lower extremities from the level of the common femoral vein through the popliteal and proximal calf veins.  Spectral Doppler was utilized to evaluate flow at rest and with distal augmentation maneuvers.  Comparison:  None.  Findings: The deep venous system of both lower extremities is well visualized and appears normal with good compressibility, plasticity, and augmentation.  No visible superficial venous thrombosis.  IMPRESSION: Normal venous Doppler examination of the lower extremities.  No evidence of superficial or deep venous thrombosis.   Original Report Authenticated By: Francene Boyers, M.D.    Dg Chest Portable 1 View  04/05/2012  *RADIOLOGY REPORT*  Clinical Data: Chest pain.  PORTABLE CHEST - 1 VIEW  Comparison: 03/26/2012  Findings: Heart size upper limits normal for technique.  Lungs are clear.  No effusion.  Visualized bones unremarkable.  IMPRESSION:  No acute disease   Original Report Authenticated By: D. Andria Rhein, MD     Microbiology: No results found for this or any previous visit (from the past 240 hour(s)).   Labs: Basic Metabolic Panel:  Lab 04/07/12 1610 04/05/12 1726  NA 137 137  K 3.8 3.6  CL 103 102  CO2 27 24  GLUCOSE 97 90  BUN 14 11  CREATININE 0.86 0.62  CALCIUM 9.1 9.8  MG -- --  PHOS -- --   Liver Function Tests:  Lab 04/05/12 1726  AST 16  ALT 17  ALKPHOS 116  BILITOT 0.3  PROT 7.2  ALBUMIN 3.7   No results found for this basename: LIPASE:5,AMYLASE:5 in the last  168 hours No results found for this basename: AMMONIA:5 in the last 168 hours CBC:  Lab 04/07/12 1903 04/05/12 1726  WBC 8.9 10.2  NEUTROABS 6.2 --  HGB 14.2 15.1*  HCT 41.4 43.7  MCV 86.4 85.5  PLT 198 201   Cardiac Enzymes:  Lab 04/08/12 1346 04/08/12 0659 04/08/12 0152 04/05/12 1726  CKTOTAL -- -- -- --  CKMB -- -- -- --  CKMBINDEX -- -- -- --  TROPONINI <0.30 <0.30 <0.30 <0.30   BNP: BNP (last 3 results) No results found for this basename: PROBNP:3 in the last 8760 hours CBG: No results found for this basename: GLUCAP:5 in the last 168 hours     Signed:  Kyian Obst  [facility] 04/08/2012, 3:43 PM

## 2012-04-08 NOTE — Progress Notes (Signed)
*  PRELIMINARY RESULTS* Echocardiogram 2D Echocardiogram has been performed.  Natalie Walker 04/08/2012, 3:18 PM

## 2012-04-08 NOTE — Progress Notes (Signed)
UR Chart Review Completed  

## 2012-04-08 NOTE — H&P (Signed)
[facility] History and Physical  Natalie Walker JWJ:[id number] DOB: 1970-11-13 DOA: 04/07/2012  Referring physician: Particia Lather PCP: Kirstie Peri, MD  Specialists: none  Chief Complaint: syncope  HPI: Natalie Walker is a 41 y.o. female with major depressive disorder, anxiety, hyperlipidemia, several prior admissions for chest pain with negative cardiac cath, an ED work-up for chest pain 3 days ago which was negative except for a mildly positive D. Dimer and negative CT angio of chest. she came in to ED complaining of syncope. She actually was seen at [facility] for same complaint earlier today but left because of a long wait. She reports that the syncope occurred this AM while at [facility], she had LOC, resolved on it own spontaneously, no injuries with collapse- witnessed by family, no seizure activity. No prodrome. No new medications, no new stressors, continues to have chest pain intermittently. No diaphoresis or SOB. According to EDP she "collapsed" in ED bathroom, unwitnessed was found unresponsive on floor in bathroom face down, after verbal and physical stimulation, she woke up. Question of mild bradycardia, measured at 60bpm only. Normal lab work except for continued mildly elevated D. Dimer. No leg pain or swelling. After collapse in ED she complained of head pain and pressure, no visible injuries, bruising or neck pain. CT of head was normal. She was tearful during my exam and questioning. Complained of "head pressure". Was told at [facility] she had a fever. No focal neuro complaints.  EDP requested admission for syncope work-up given presentation in ED, She is a Agricultural engineer at [employer], has a history of suicide attempt, depression and anxiety. Husband not at bedside for questioning since EDP informed him that she would be admitted and would not need transportation home.  Review of Systems:   Past Medical History  Diagnosis Date  . Polycystic ovarian disease   .  Asthma   . Anxiety   . Depression   . Chest pain     NO CAD by cath 12/12/11, nl LV fxn  . Hypertriglyceridemia   . Tobacco abuse   . Obesity    Past Surgical History  Procedure Date  . Cesarean section   . Ectopic pregnancy surgery   . Cholecystectomy   . Dilation and curettage of uterus   . Cardiac catheterization   . Tubal ligation    Social History:  reports that she has been smoking Cigarettes.  She has been smoking about .5 packs per day. She does not have any smokeless tobacco history on file. She reports that she does not drink alcohol or use illicit drugs. Lives at home with her husband.  No Known Allergies  No family history on file. No known family history of sudden cardiac death or heart disease.  Prior to Admission medications   Medication Sig Start Date End Date Taking? Authorizing Provider  acetaminophen (TYLENOL) 500 MG tablet Take 1,000 mg by mouth every 6 (six) hours as needed. pain   Yes Historical Provider, MD  ALPRAZolam (XANAX) 1 MG tablet Take 1 tablet (1 mg total) by mouth 3 (three) times daily as needed for anxiety. For anxiety 03/22/12  Yes Mike Craze, MD  escitalopram (LEXAPRO) 20 MG tablet Take 1 tablet (20 mg total) by mouth daily. 03/22/12  Yes Mike Craze, MD  ibuprofen (ADVIL,MOTRIN) 200 MG tablet Take 400-800 mg by mouth every 6 (six) hours as needed. pain   Yes Historical Provider, MD  metFORMIN (GLUCOPHAGE) 1000 MG tablet Take 1 tablet (1,000 mg total)  by mouth 2 (two) times daily. Please hold for 48hrs after your heart catheterization. You may resume on 12/15/11 12/13/11  Yes Jessica A Hope, PA-C  nicotine (NICODERM CQ - DOSED IN MG/24 HOURS) 14 mg/24hr patch Place 1 patch onto the skin daily.   Yes Historical Provider, MD   Physical Exam: Filed Vitals:   04/07/12 1838 04/07/12 1958  BP: 128/68 103/52  Pulse: 82 62  Temp: 97.8 F (36.6 C)   TempSrc: Oral   Resp: 20 20  Height: 5\' 2"  (1.575 m)   Weight: 129.729 kg (286 lb)   SpO2: 99%  100%     General:  Overweight, tearful, cooperative with exam  Eyes: normal  ENT: Nasal congestion, facial pain and pressure  Neck: supple  Cardiovascular: RRR, no mrg  Respiratory: CTAB  Abdomen: soft, NT  Skin: no rashes or lesions  Musculoskeletal: normal, no leg tenderness or swelling  Psychiatric: depressed anxious   Neurologic: non-focal  Labs on Admission:  Basic Metabolic Panel:  Lab 04/07/12 6295 04/05/12 1726  NA 137 137  K 3.8 3.6  CL 103 102  CO2 27 24  GLUCOSE 97 90  BUN 14 11  CREATININE 0.86 0.62  CALCIUM 9.1 9.8  MG -- --  PHOS -- --   Liver Function Tests:  Lab 04/05/12 1726  AST 16  ALT 17  ALKPHOS 116  BILITOT 0.3  PROT 7.2  ALBUMIN 3.7   CBC:  Lab 04/07/12 1903 04/05/12 1726  WBC 8.9 10.2  NEUTROABS 6.2 --  HGB 14.2 15.1*  HCT 41.4 43.7  MCV 86.4 85.5  PLT 198 201   Cardiac Enzymes:  Lab 04/05/12 1726  CKTOTAL --  CKMB --  CKMBINDEX --  TROPONINI <0.30    BNP (last 3 results) No results found for this basename: PROBNP:3 in the last 8760 hours CBG: No results found for this basename: GLUCAP:5 in the last 168 hours  Radiological Exams on Admission: Ct Head Wo Contrast  04/07/2012  *RADIOLOGY REPORT*  Clinical Data: Status post fall; dizziness and weakness.  Frontal headache.  CT HEAD WITHOUT CONTRAST  Technique:  Contiguous axial images were obtained from the base of the skull through the vertex without contrast.  Comparison: CT of the head performed 10/29/2010  Findings: There is no evidence of acute infarction, mass lesion, or intra- or extra-axial hemorrhage on CT.  The posterior fossa, including the cerebellum, brainstem and fourth ventricle, is within normal limits.  The third and lateral ventricles, and basal ganglia are unremarkable in appearance.  The cerebral hemispheres are symmetric in appearance, with normal gray- white differentiation.  No mass effect or midline shift is seen.  There is no evidence of  fracture; visualized osseous structures are unremarkable in appearance.  The orbits are within normal limits. The paranasal sinuses and mastoid air cells are well-aerated.  No significant soft tissue abnormalities are seen.  IMPRESSION: No evidence of traumatic intracranial injury or fracture.   Original Report Authenticated By: Tonia Ghent, M.D.     EKG: Independently reviewed, NSR, no ischemic changes or arrythmias Assessment/Plan Principal Problem:  *Syncope  41 yo with psychiatric history presents with syncope. She has had a completely normal cardiac work-up for recurrent chest pain in July 2013 including heart catheterization. She is a smoker and has HLD but no HTN or other risk factors for CAD. Nothing to suggest seizure, she takes benzos regularly, no illicit drug use. No significant bradycardia. All labs normal except unexplained D. Dimer mild elevation. I  strongly suspect this is psychogenic, related to anxiety or possible secondary gain-especially since a true syncopal collapse on a tile floor would likely confer a significant injury or at least bruising. I discussed this with patient and that other than brief observation on tele overnight there were few other tests that I could offer as an inpatient to determine the source of her episode, I discussed with EDP who insisted on admission. Agreed to admit as observation for further monitoring.  Monitor on Tele  Cycle CEs  Check 2D echo for valvular dx  Carotid Dopplers  Extremity dopplers to r/o DVT  Continue all current home medications.  Code Status: Full Code Family Communication: Discussed plan with patient only. Disposition Plan: Home tomorrow if tele ordered, she agrees to see her PCP this week to complete work-up if needed.  Time spent: 50 minutes  [facility] Pager [phone]  If 7PM-7AM, please contact night-coverage [email or web address] Password [facility] 04/08/2012, 12:57 AM

## 2012-04-24 ENCOUNTER — Ambulatory Visit (HOSPITAL_COMMUNITY): Payer: Self-pay | Admitting: Psychiatry

## 2012-05-03 ENCOUNTER — Encounter (HOSPITAL_COMMUNITY): Payer: Self-pay | Admitting: Psychiatry

## 2012-05-03 ENCOUNTER — Ambulatory Visit (INDEPENDENT_AMBULATORY_CARE_PROVIDER_SITE_OTHER): Payer: PRIVATE HEALTH INSURANCE | Admitting: Psychiatry

## 2012-05-03 VITALS — Wt 290.0 lb

## 2012-05-03 DIAGNOSIS — F411 Generalized anxiety disorder: Secondary | ICD-10-CM

## 2012-05-03 DIAGNOSIS — F329 Major depressive disorder, single episode, unspecified: Secondary | ICD-10-CM

## 2012-05-03 DIAGNOSIS — F331 Major depressive disorder, recurrent, moderate: Secondary | ICD-10-CM

## 2012-05-03 DIAGNOSIS — F419 Anxiety disorder, unspecified: Secondary | ICD-10-CM

## 2012-05-03 DIAGNOSIS — F17200 Nicotine dependence, unspecified, uncomplicated: Secondary | ICD-10-CM

## 2012-05-03 MED ORDER — OMEGA-3-ACID ETHYL ESTERS 1 G PO CAPS: 1.0000 g | ORAL_CAPSULE | Freq: Two times a day (BID) | ORAL | Status: DC

## 2012-05-03 MED ORDER — ALPRAZOLAM 1 MG PO TABS: 1.0000 mg | ORAL_TABLET | Freq: Three times a day (TID) | ORAL | Status: DC | PRN

## 2012-05-03 MED ORDER — NICOTINE 14 MG/24HR TD PT24: 1.0000 | MEDICATED_PATCH | TRANSDERMAL | Status: DC

## 2012-05-03 MED ORDER — NICOTINE 21 MG/24HR TD PT24: 1.0000 | MEDICATED_PATCH | TRANSDERMAL | Status: DC

## 2012-05-03 MED ORDER — ESCITALOPRAM OXALATE 20 MG PO TABS: 20.0000 mg | ORAL_TABLET | Freq: Every day | ORAL | Status: DC

## 2012-05-03 NOTE — Patient Instructions (Signed)
CUT BACK/CUT OUT on sugar and carbohydrates, that means very limited fruits and starchy vegetables and very limited grains, breads  The goal is low GLYCEMIC INDEX.  CUT OUT all wheat, rye, or barley for the gluten in them.  HIGH fat and LOW carbohydrate diet is the KEY.  Eat avocados, eggs, lean meat like grass fed beef and chicken  Nuts and seeds would be good foods as well.   Stevia is an excellent sweetener.  Safe for the brain.   Almond butter is awesome.  For brain recovery, it advised that you get  regular exercise, regular sleep, and  consume good quality, fish oil, 1000 mg twice a day. These 3 things are the foundation of rehabilitating your brain. Staying off all abusable substances including nicotine, caffeine, and refined sugar and avoiding further head injuries are the other important elements in helping you keep your brain working the best it can for you. If memory is a problem then INSTEAD of the fish oil mentioned above, try using Brain Power Basics from [employer].  You can order online or by phone [phone]. It costs $99 for the first month, and $80 monthly thereafter, but that investment in your brain and the recovery of your brain proper functioning would seem worth it.  "[employer] by Camelia Phenes could be very helpful.  "[place]" compiled by [employer] could be helpful.  Have a happy holiday.

## 2012-05-03 NOTE — Progress Notes (Signed)
Chief complaint Chief Complaint  Patient presents with  . Anxiety  . Follow-up  . Medication Refill   Subjective: "I think I am doing good".  History of presenting illness Patient is 41 year old Caucasian married employed female who came for her followup appointment.Pt reports that she is compliant with the psychotropic medications with definite benefit and no noticible side effects.  She is thinking that she needs the 14 mg of Nicoderm patch.  Will go with that.  She was challenged to seek more of a spiritual time for herself each day.   Current psychiatric medication Nicoderm 7 mg, needing to be 14's Xanax 1 mg 3 times a day Lexapro 20 mg daily  Past psychiatric history Patient has a previous history of psychiatric inpatient treatment or suicidal attempt.  She denies a history of psychosis paranoia violence.  In the past she has taken Wellbutrin to stop smoking however it has given significant headache.  She had a good response with Abilify however she gained weight with Abilify.  She is been taking Prozac for many years.  Psychosocial history Patient lives with her husband and children.  She is working as a Engineer, civil (consulting) in [employer].  Medical history Patient has significant obesity, polycystic ovarian disease and chronic pain. She takes metformin for weight loss.  Mental status examination Patient is casually dressed and fairly groomed. She appears cooperative and pleasant.  She described her mood is good and her affect is mood appropriate.  She denies any auditory or visual hallucination.  She denies any active or passive suicidal thoughts or homicidal thoughts.  There were no flight of ideas or loose association.  She maintained good eye contact.  There were no paranoia or delusion or psychotic symptoms present.  She's alert and oriented x3.  Her attention and concentration is improved from the past.  Her insight judgment and pulse control is okay.  Assessment Axis I Major  depressive disorder, Anxiety, Nicotine Dependence Axis II deferred Axis III see medical history Axis IV mild to moderate Axis V 70 - 75  Plan I took her vitals.  I reviewed CC, tobacco/med/surg Hx, meds effects/ side effects, problem list, therapies and responses as well as current situation/symptoms discussed options. See orders and pt instructions for more details.

## 2012-06-29 ENCOUNTER — Other Ambulatory Visit: Payer: Self-pay

## 2012-07-30 ENCOUNTER — Ambulatory Visit (HOSPITAL_COMMUNITY): Payer: Self-pay | Admitting: Psychiatry

## 2012-08-02 ENCOUNTER — Ambulatory Visit (INDEPENDENT_AMBULATORY_CARE_PROVIDER_SITE_OTHER): Payer: PRIVATE HEALTH INSURANCE | Admitting: Psychiatry

## 2012-08-02 ENCOUNTER — Encounter (HOSPITAL_COMMUNITY): Payer: Self-pay | Admitting: Psychiatry

## 2012-08-02 VITALS — Wt 282.0 lb

## 2012-08-02 DIAGNOSIS — F331 Major depressive disorder, recurrent, moderate: Secondary | ICD-10-CM

## 2012-08-02 DIAGNOSIS — F329 Major depressive disorder, single episode, unspecified: Secondary | ICD-10-CM

## 2012-08-02 DIAGNOSIS — F411 Generalized anxiety disorder: Secondary | ICD-10-CM

## 2012-08-02 DIAGNOSIS — F17200 Nicotine dependence, unspecified, uncomplicated: Secondary | ICD-10-CM

## 2012-08-02 DIAGNOSIS — F419 Anxiety disorder, unspecified: Secondary | ICD-10-CM

## 2012-08-02 MED ORDER — NICOTINE 7 MG/24HR TD PT24: 1.0000 | MEDICATED_PATCH | TRANSDERMAL | Status: DC

## 2012-08-02 MED ORDER — ALPRAZOLAM 1 MG PO TABS: 1.0000 mg | ORAL_TABLET | Freq: Three times a day (TID) | ORAL | Status: DC | PRN

## 2012-08-02 MED ORDER — ESCITALOPRAM OXALATE 20 MG PO TABS: 20.0000 mg | ORAL_TABLET | Freq: Every day | ORAL | Status: DC

## 2012-08-02 NOTE — Progress Notes (Signed)
[facility] [id number] Progress Note Natalie Walker MRN: [id number] DOB: May 25, 1970 Age: 42 y.o.  Date: 08/02/2012 Start Time: 11:30 AM End Time: 11:40 AM  Chief Complaint: Chief Complaint  Patient presents with  . Anxiety  . Follow-up  . Medication Refill    Subjective: "I think I am doing good". Depression 0/10 and Anxiety 3/10, where 1 is the best and 10 is the worst. Pain is 0/10   History of presenting illness Patient is 42 year old Caucasian married employed female who came for her followup appointment.Pt reports that she is compliant with the psychotropic medications with definite benefit and no noticible side effects.  She is thinking that she needs the 14 mg of Nicoderm patch.  Will go with that.  She is seeking some spiritual time for herself each day and seeing a difference.  Current psychiatric medication Nicoderm 14 mg Xanax 1 mg 3 times a day Lexapro 20 mg daily  Past psychiatric history Patient has a previous history of psychiatric inpatient treatment or suicidal attempt.  She denies a history of psychosis paranoia violence.  In the past she has taken Wellbutrin to stop smoking however it has given significant headache.  She had a good response with Abilify however she gained weight with Abilify.  She is been taking Prozac for many years.  Psychosocial history Patient lives with her husband and children.  She is working as a Engineer, civil (consulting) in [employer].  Family History family history is not on file.  Medical history Patient has significant obesity, polycystic ovarian disease and chronic pain. She takes metformin for weight loss.  Mental status examination Patient is casually dressed and fairly groomed. She appears cooperative and pleasant.  She described her mood is good and her affect is mood appropriate.  She denies any auditory or visual hallucination.  She denies any active or passive suicidal thoughts or homicidal thoughts.  There were no flight of  ideas or loose association.  She maintained good eye contact.  There were no paranoia or delusion or psychotic symptoms present.  She's alert and oriented x3.  Her attention and concentration is improved from the past.  Her insight judgment and pulse control is okay.  Lab Results:  Results for orders placed during the hospital encounter of 04/07/12 (from the past 8736 hour(s))  CBC WITH DIFFERENTIAL   Collection Time    04/07/12  7:03 PM      Result Value Range   WBC 8.9  4.0 - 10.5 K/uL   RBC 4.79  3.87 - 5.11 MIL/uL   Hemoglobin 14.2  12.0 - 15.0 g/dL   HCT 57.8  46.9 - 62.9 %   MCV 86.4  78.0 - 100.0 fL   MCH 29.6  26.0 - 34.0 pg   MCHC 34.3  30.0 - 36.0 g/dL   RDW 52.8  41.3 - 24.4 %   Platelets 198  150 - 400 K/uL   Neutrophils Relative 70  43 - 77 %   Neutro Abs 6.2  1.7 - 7.7 K/uL   Lymphocytes Relative 22  12 - 46 %   Lymphs Abs 1.9  0.7 - 4.0 K/uL   Monocytes Relative 7  3 - 12 %   Monocytes Absolute 0.6  0.1 - 1.0 K/uL   Eosinophils Relative 2  0 - 5 %   Eosinophils Absolute 0.2  0.0 - 0.7 K/uL   Basophils Relative 0  0 - 1 %   Basophils Absolute 0.0  0.0 - 0.1 K/uL  BASIC METABOLIC PANEL   Collection Time    04/07/12  7:03 PM      Result Value Range   Sodium 137  135 - 145 mEq/L   Potassium 3.8  3.5 - 5.1 mEq/L   Chloride 103  96 - 112 mEq/L   CO2 27  19 - 32 mEq/L   Glucose, Bld 97  70 - 99 mg/dL   BUN 14  6 - 23 mg/dL   Creatinine, Ser 1.61  0.50 - 1.10 mg/dL   Calcium 9.1  8.4 - 09.6 mg/dL   GFR calc non Af Amer 83 (*) >90 mL/min   GFR calc Af Amer >90  >90 mL/min  D-DIMER, QUANTITATIVE   Collection Time    04/07/12  7:03 PM      Result Value Range   D-Dimer, Quant 0.51 (*) 0.00 - 0.48 ug/mL-FEU  PREGNANCY, URINE   Collection Time    04/07/12  8:15 PM      Result Value Range   Preg Test, Ur NEGATIVE  NEGATIVE  URINALYSIS, ROUTINE W REFLEX MICROSCOPIC   Collection Time    04/07/12  8:15 PM      Result Value Range   Color, Urine YELLOW  YELLOW    APPearance CLEAR  CLEAR   Specific Gravity, Urine 1.025  1.005 - 1.030   pH 6.5  5.0 - 8.0   Glucose, UA NEGATIVE  NEGATIVE mg/dL   Hgb urine dipstick TRACE (*) NEGATIVE   Bilirubin Urine NEGATIVE  NEGATIVE   Ketones, ur NEGATIVE  NEGATIVE mg/dL   Protein, ur NEGATIVE  NEGATIVE mg/dL   Urobilinogen, UA 0.2  0.0 - 1.0 mg/dL   Nitrite NEGATIVE  NEGATIVE   Leukocytes, UA NEGATIVE  NEGATIVE  URINE RAPID DRUG SCREEN (HOSP PERFORMED)   Collection Time    04/07/12  8:15 PM      Result Value Range   Opiates Walker DETECTED  Walker DETECTED   Cocaine Walker DETECTED  Walker DETECTED   Benzodiazepines POSITIVE (*) Walker DETECTED   Amphetamines Walker DETECTED  Walker DETECTED   Tetrahydrocannabinol Walker DETECTED  Walker DETECTED   Barbiturates Walker DETECTED  Walker DETECTED  URINE MICROSCOPIC-ADD ON   Collection Time    04/07/12  8:15 PM      Result Value Range   Squamous Epithelial / LPF FEW (*) RARE   WBC, UA 3-6  <3 WBC/hpf   RBC / HPF 3-6  <3 RBC/hpf   Bacteria, UA RARE  RARE  TROPONIN I   Collection Time    04/08/12  1:52 AM      Result Value Range   Troponin I <0.30  <0.30 ng/mL  LIPID PANEL   Collection Time    04/08/12  1:53 AM      Result Value Range   Cholesterol 149  0 - 200 mg/dL   Triglycerides 045  <409 mg/dL   HDL 36 (*) >81 mg/dL   Total CHOL/HDL Ratio 4.1     VLDL 28  0 - 40 mg/dL   LDL Cholesterol 85  0 - 99 mg/dL  TROPONIN I   Collection Time    04/08/12  6:59 AM      Result Value Range   Troponin I <0.30  <0.30 ng/mL  TSH   Collection Time    04/08/12 11:00 AM      Result Value Range   TSH 1.716  0.350 - 4.500 uIU/mL  TROPONIN I   Collection Time    04/08/12  1:46 PM      Result Value Range   Troponin I <0.30  <0.30 ng/mL  Results for orders placed during the hospital encounter of 04/05/12 (from the past 8736 hour(s))  COMPREHENSIVE METABOLIC PANEL   Collection Time    04/05/12  5:26 PM      Result Value Range   Sodium 137  135 - 145 mEq/L   Potassium 3.6   3.5 - 5.1 mEq/L   Chloride 102  96 - 112 mEq/L   CO2 24  19 - 32 mEq/L   Glucose, Bld 90  70 - 99 mg/dL   BUN 11  6 - 23 mg/dL   Creatinine, Ser 4.78  0.50 - 1.10 mg/dL   Calcium 9.8  8.4 - 29.5 mg/dL   Total Protein 7.2  6.0 - 8.3 g/dL   Albumin 3.7  3.5 - 5.2 g/dL   AST 16  0 - 37 U/L   ALT 17  0 - 35 U/L   Alkaline Phosphatase 116  39 - 117 U/L   Total Bilirubin 0.3  0.3 - 1.2 mg/dL   GFR calc non Af Amer >90  >90 mL/min   GFR calc Af Amer >90  >90 mL/min  CBC   Collection Time    04/05/12  5:26 PM      Result Value Range   WBC 10.2  4.0 - 10.5 K/uL   RBC 5.11  3.87 - 5.11 MIL/uL   Hemoglobin 15.1 (*) 12.0 - 15.0 g/dL   HCT 62.1  30.8 - 65.7 %   MCV 85.5  78.0 - 100.0 fL   MCH 29.5  26.0 - 34.0 pg   MCHC 34.6  30.0 - 36.0 g/dL   RDW 84.6  96.2 - 95.2 %   Platelets 201  150 - 400 K/uL  TROPONIN I   Collection Time    04/05/12  5:26 PM      Result Value Range   Troponin I <0.30  <0.30 ng/mL  D-DIMER, QUANTITATIVE   Collection Time    04/05/12  5:26 PM      Result Value Range   D-Dimer, Quant 0.64 (*) 0.00 - 0.48 ug/mL-FEU  Results for orders placed during the hospital encounter of 12/12/11 (from the past 8736 hour(s))  GLUCOSE, CAPILLARY   Collection Time    12/12/11  7:19 PM      Result Value Range   Glucose-Capillary 90  70 - 99 mg/dL   Comment 1 Notify RN     Comment 2 Documented in Chart    TSH   Collection Time    12/12/11  7:40 PM      Result Value Range   TSH 1.518  0.350 - 4.500 uIU/mL  LIPID PANEL   Collection Time    12/13/11  5:00 AM      Result Value Range   Cholesterol 157  0 - 200 mg/dL   Triglycerides 841 (*) <150 mg/dL   HDL 33 (*) >32 mg/dL   Total CHOL/HDL Ratio 4.8     VLDL 41 (*) 0 - 40 mg/dL   LDL Cholesterol 83  0 - 99 mg/dL  PCP draws routine labs and nothing is emerging as of concern.  Assessment Axis I Major depressive disorder, Anxiety, Nicotine Dependence Axis II deferred Axis III see medical history Axis IV mild to  moderate Axis V 70 - 75  Plan/Discussion: I took her vitals.  I reviewed CC, tobacco/med/surg Hx, meds effects/ side effects, problem list, therapies  and responses as well as current situation/symptoms discussed options. Continue current effective medications. See orders and pt instructions for more details.  Medical Decision Making Problem Points:  Established problem, stable/improving (1), Review of last therapy session (1) and Review of psycho-social stressors (1) Data Points:  Review or order clinical lab tests (1) Review of medication regiment & side effects (2)  I certify that outpatient services furnished can reasonably be expected to improve the patient's condition.   Orson Aloe, MD, [facility]

## 2012-08-02 NOTE — Patient Instructions (Signed)
Keep it up.  Call if problems or concerns.  

## 2012-12-20 ENCOUNTER — Telehealth (HOSPITAL_COMMUNITY): Payer: Self-pay | Admitting: Psychiatry

## 2012-12-23 ENCOUNTER — Telehealth (HOSPITAL_COMMUNITY): Payer: Self-pay | Admitting: Psychiatry

## 2012-12-24 ENCOUNTER — Other Ambulatory Visit (HOSPITAL_COMMUNITY): Payer: Self-pay | Admitting: Psychiatry

## 2012-12-24 DIAGNOSIS — F331 Major depressive disorder, recurrent, moderate: Secondary | ICD-10-CM

## 2012-12-24 MED ORDER — ALPRAZOLAM 1 MG PO TABS: 1.0000 mg | ORAL_TABLET | Freq: Three times a day (TID) | ORAL | Status: DC | PRN

## 2012-12-27 ENCOUNTER — Ambulatory Visit (HOSPITAL_COMMUNITY): Payer: Self-pay | Admitting: Psychiatry

## 2013-01-07 ENCOUNTER — Ambulatory Visit (HOSPITAL_COMMUNITY): Payer: Self-pay | Admitting: Psychiatry

## 2013-01-15 ENCOUNTER — Encounter (HOSPITAL_COMMUNITY): Payer: Self-pay | Admitting: Psychiatry

## 2013-01-15 ENCOUNTER — Ambulatory Visit (INDEPENDENT_AMBULATORY_CARE_PROVIDER_SITE_OTHER): Payer: PRIVATE HEALTH INSURANCE | Admitting: Psychiatry

## 2013-01-15 VITALS — Ht 63.0 in | Wt 267.0 lb

## 2013-01-15 DIAGNOSIS — F411 Generalized anxiety disorder: Secondary | ICD-10-CM

## 2013-01-15 DIAGNOSIS — F17200 Nicotine dependence, unspecified, uncomplicated: Secondary | ICD-10-CM

## 2013-01-15 DIAGNOSIS — F331 Major depressive disorder, recurrent, moderate: Secondary | ICD-10-CM

## 2013-01-15 DIAGNOSIS — F329 Major depressive disorder, single episode, unspecified: Secondary | ICD-10-CM

## 2013-01-15 MED ORDER — ESCITALOPRAM OXALATE 20 MG PO TABS: 20.0000 mg | ORAL_TABLET | Freq: Two times a day (BID) | ORAL | Status: DC

## 2013-01-15 MED ORDER — ALPRAZOLAM 1 MG PO TABS: 1.0000 mg | ORAL_TABLET | Freq: Three times a day (TID) | ORAL | Status: DC | PRN

## 2013-01-15 NOTE — Progress Notes (Signed)
Patient ID: Natalie Walker, female   DOB: [birth year], 42 y.o.   MRN: [id number] [facility] [address] Progress Note Natalie Walker MRN: [id number] DOB: October 30, 1970 Age: 42 y.o.  Date: 01/15/2013 Start Time: 11:30 AM End Time: 11:40 AM  Chief Complaint: Chief Complaint  Patient presents with  . Anxiety  . Depression  . Follow-up  . Medication Refill    Subjective: "I've not been doing well the last few weeks."  This patient is a 42 year old married white female who lives with her husband, a daughter age 22 and a son age 37 in [place]. She works as a Lawyer at [employer].  Patient states that she first began getting depressed about 10 years ago. She has polycystic ovary disease and had a difficult time getting pregnant. She went through several miscarriages. For the past 10 years or so she been on Prozac which helped but eventually it burned out. She was started on Lexapro 20 mg per day last March. She was doing well until about 3 or four weeks ago. 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 She gets upset and depressed when the patients die. Her own father died of cancer several years ago and this seems to be stirring up bad memories.  Over the last 2-3 weeks the patient has become more depressed and anxious. Over the past week and she was thinking of wanting to die although she had no specific plan to hurt her self. Her sleep has been good her energy is pretty good and she has a lot of family and church support.     Current psychiatric medication  Xanax 1 mg 3 times a day Lexapro 20 mg daily  Past psychiatric history Patient has a previous history of psychiatric inpatient treatment or suicidal attempt.  She denies a history of psychosis paranoia violence.  In the past she has taken Wellbutrin to stop smoking however it has given significant headache.  She had a good response with Abilify however she gained weight with Abilify.   She had been taking Prozac for many years.  Psychosocial history Patient lives with her husband and children.  She is working as a Engineer, civil (consulting) in [employer].  Family History family history includes Depression in her paternal aunt and paternal uncle.  Medical history Patient has significant obesity, polycystic ovarian disease and chronic pain. She takes metformin for weight loss.  Mental status examination Patient is casually dressed and fairly groomed. She appears cooperative and pleasant.  She described her mood as depressed and anxious and her affect is a bit agitated and worried.  She denies any auditory or visual hallucination.  She does have passive suicidal thoughts without plan but no homicidal thoughts.  There were no flight of ideas or loose association.  She maintained good eye contact.  There were no paranoia or delusion or psychotic symptoms present.  She's alert and oriented x3.  Her attention and concentration is improved from the past.  Her insight judgment and pulse control is okay.  Lab Results:  Results for orders placed during the hospital encounter of 04/07/12 (from the past 8736 hour(s))  CBC WITH DIFFERENTIAL   Collection Time    04/07/12  7:03 PM      Result Value Range   WBC 8.9  4.0 - 10.5 K/uL   RBC 4.79  3.87 - 5.11 MIL/uL   Hemoglobin 14.2  12.0 - 15.0 g/dL   HCT 41.4  36.0 - 46.0 %   MCV 86.4  78.0 - 100.0 fL   MCH 29.6  26.0 - 34.0 pg   MCHC 34.3  30.0 - 36.0 g/dL   RDW 40.9  81.1 - 91.4 %   Platelets 198  150 - 400 K/uL   Neutrophils Relative % 70  43 - 77 %   Neutro Abs 6.2  1.7 - 7.7 K/uL   Lymphocytes Relative 22  12 - 46 %   Lymphs Abs 1.9  0.7 - 4.0 K/uL   Monocytes Relative 7  3 - 12 %   Monocytes Absolute 0.6  0.1 - 1.0 K/uL   Eosinophils Relative 2  0 - 5 %   Eosinophils Absolute 0.2  0.0 - 0.7 K/uL   Basophils Relative 0  0 - 1 %   Basophils Absolute 0.0  0.0 - 0.1 K/uL  BASIC METABOLIC PANEL   Collection Time    04/07/12  7:03 PM       Result Value Range   Sodium 137  135 - 145 mEq/L   Potassium 3.8  3.5 - 5.1 mEq/L   Chloride 103  96 - 112 mEq/L   CO2 27  19 - 32 mEq/L   Glucose, Bld 97  70 - 99 mg/dL   BUN 14  6 - 23 mg/dL   Creatinine, Ser 7.82  0.50 - 1.10 mg/dL   Calcium 9.1  8.4 - 95.6 mg/dL   GFR calc non Af Amer 83 (*) >90 mL/min   GFR calc Af Amer >90  >90 mL/min  D-DIMER, QUANTITATIVE   Collection Time    04/07/12  7:03 PM      Result Value Range   D-Dimer, Quant 0.51 (*) 0.00 - 0.48 ug/mL-FEU  PREGNANCY, URINE   Collection Time    04/07/12  8:15 PM      Result Value Range   Preg Test, Ur NEGATIVE  NEGATIVE  URINALYSIS, ROUTINE W REFLEX MICROSCOPIC   Collection Time    04/07/12  8:15 PM      Result Value Range   Color, Urine YELLOW  YELLOW   APPearance CLEAR  CLEAR   Specific Gravity, Urine 1.025  1.005 - 1.030   pH 6.5  5.0 - 8.0   Glucose, UA NEGATIVE  NEGATIVE mg/dL   Hgb urine dipstick TRACE (*) NEGATIVE   Bilirubin Urine NEGATIVE  NEGATIVE   Ketones, ur NEGATIVE  NEGATIVE mg/dL   Protein, ur NEGATIVE  NEGATIVE mg/dL   Urobilinogen, UA 0.2  0.0 - 1.0 mg/dL   Nitrite NEGATIVE  NEGATIVE   Leukocytes, UA NEGATIVE  NEGATIVE  URINE RAPID DRUG SCREEN (HOSP PERFORMED)   Collection Time    04/07/12  8:15 PM      Result Value Range   Opiates NONE DETECTED  NONE DETECTED   Cocaine NONE DETECTED  NONE DETECTED   Benzodiazepines POSITIVE (*) NONE DETECTED   Amphetamines NONE DETECTED  NONE DETECTED   Tetrahydrocannabinol NONE DETECTED  NONE DETECTED   Barbiturates NONE DETECTED  NONE DETECTED  URINE MICROSCOPIC-ADD ON   Collection Time    04/07/12  8:15 PM      Result Value Range   Squamous Epithelial / LPF FEW (*) RARE   WBC, UA 3-6  <3 WBC/hpf   RBC / HPF 3-6  <3 RBC/hpf   Bacteria, UA RARE  RARE  TROPONIN I   Collection Time    04/08/12  1:52 AM      Result Value Range  Troponin I <0.30  <0.30 ng/mL  LIPID PANEL   Collection Time    04/08/12  1:53 AM      Result Value Range    Cholesterol 149  0 - 200 mg/dL   Triglycerides 161  <096 mg/dL   HDL 36 (*) >04 mg/dL   Total CHOL/HDL Ratio 4.1     VLDL 28  0 - 40 mg/dL   LDL Cholesterol 85  0 - 99 mg/dL  TROPONIN I   Collection Time    04/08/12  6:59 AM      Result Value Range   Troponin I <0.30  <0.30 ng/mL  TSH   Collection Time    04/08/12 11:00 AM      Result Value Range   TSH 1.716  0.350 - 4.500 uIU/mL  TROPONIN I   Collection Time    04/08/12  1:46 PM      Result Value Range   Troponin I <0.30  <0.30 ng/mL  Results for orders placed during the hospital encounter of 04/05/12 (from the past 8736 hour(s))  COMPREHENSIVE METABOLIC PANEL   Collection Time    04/05/12  5:26 PM      Result Value Range   Sodium 137  135 - 145 mEq/L   Potassium 3.6  3.5 - 5.1 mEq/L   Chloride 102  96 - 112 mEq/L   CO2 24  19 - 32 mEq/L   Glucose, Bld 90  70 - 99 mg/dL   BUN 11  6 - 23 mg/dL   Creatinine, Ser 5.40  0.50 - 1.10 mg/dL   Calcium 9.8  8.4 - 98.1 mg/dL   Total Protein 7.2  6.0 - 8.3 g/dL   Albumin 3.7  3.5 - 5.2 g/dL   AST 16  0 - 37 U/L   ALT 17  0 - 35 U/L   Alkaline Phosphatase 116  39 - 117 U/L   Total Bilirubin 0.3  0.3 - 1.2 mg/dL   GFR calc non Af Amer >90  >90 mL/min   GFR calc Af Amer >90  >90 mL/min  CBC   Collection Time    04/05/12  5:26 PM      Result Value Range   WBC 10.2  4.0 - 10.5 K/uL   RBC 5.11  3.87 - 5.11 MIL/uL   Hemoglobin 15.1 (*) 12.0 - 15.0 g/dL   HCT 19.1  47.8 - 29.5 %   MCV 85.5  78.0 - 100.0 fL   MCH 29.5  26.0 - 34.0 pg   MCHC 34.6  30.0 - 36.0 g/dL   RDW 62.1  30.8 - 65.7 %   Platelets 201  150 - 400 K/uL  TROPONIN I   Collection Time    04/05/12  5:26 PM      Result Value Range   Troponin I <0.30  <0.30 ng/mL  D-DIMER, QUANTITATIVE   Collection Time    04/05/12  5:26 PM      Result Value Range   D-Dimer, Quant 0.64 (*) 0.00 - 0.48 ug/mL-FEU  PCP draws routine labs and nothing is emerging as of concern.  Assessment Axis I Major depressive disorder,  Anxiety, Nicotine Dependence Axis II deferred Axis III see medical history Axis IV moderate Axis V 70 - 75  Plan/Discussion: I took her vitals.  I reviewed CC, tobacco/med/surg Hx, meds effects/ side effects, problem list, therapies and responses as well as current situation/symptoms discussed options. The patient's depression and anxiety symptoms have been worse recently.  We'll increase Lexapro to 40 mg every morning and continue Xanax 1 mg 3 times a day. She declines counseling here she has support from her church group. She will call immediately if she has any more suicidal thoughts. She'll return to see me in four-week's See orders and pt instructions for more details.  Medical Decision Making Problem Points:  Established problem, stable/improving (1), Review of last therapy session (1) and Review of psycho-social stressors (1) Data Points:  Review or order clinical lab tests (1) Review of medication regiment & side effects (2)  I certify that outpatient services furnished can reasonably be expected to improve the patient's condition.   Diannia Ruder, MD

## 2013-02-25 ENCOUNTER — Ambulatory Visit (INDEPENDENT_AMBULATORY_CARE_PROVIDER_SITE_OTHER): Payer: PRIVATE HEALTH INSURANCE | Admitting: Psychiatry

## 2013-02-25 ENCOUNTER — Emergency Department (HOSPITAL_COMMUNITY): Payer: PRIVATE HEALTH INSURANCE

## 2013-02-25 ENCOUNTER — Encounter (HOSPITAL_COMMUNITY): Payer: Self-pay | Admitting: Emergency Medicine

## 2013-02-25 ENCOUNTER — Emergency Department (HOSPITAL_COMMUNITY)
Admission: EM | Admit: 2013-02-25 | Discharge: 2013-02-25 | Disposition: A | Discharge status: Home / Self Care | Payer: PRIVATE HEALTH INSURANCE | Attending: Emergency Medicine | Admitting: Emergency Medicine

## 2013-02-25 ENCOUNTER — Encounter (HOSPITAL_COMMUNITY): Payer: Self-pay | Admitting: Psychiatry

## 2013-02-25 VITALS — BP 130/92 | Ht 63.0 in | Wt 270.0 lb

## 2013-02-25 DIAGNOSIS — J45909 Unspecified asthma, uncomplicated: Secondary | ICD-10-CM | POA: Insufficient documentation

## 2013-02-25 DIAGNOSIS — G43909 Migraine, unspecified, not intractable, without status migrainosus: Secondary | ICD-10-CM | POA: Insufficient documentation

## 2013-02-25 DIAGNOSIS — F411 Generalized anxiety disorder: Secondary | ICD-10-CM | POA: Insufficient documentation

## 2013-02-25 DIAGNOSIS — Z87891 Personal history of nicotine dependence: Secondary | ICD-10-CM | POA: Insufficient documentation

## 2013-02-25 DIAGNOSIS — F329 Major depressive disorder, single episode, unspecified: Secondary | ICD-10-CM

## 2013-02-25 DIAGNOSIS — F17200 Nicotine dependence, unspecified, uncomplicated: Secondary | ICD-10-CM

## 2013-02-25 DIAGNOSIS — E669 Obesity, unspecified: Secondary | ICD-10-CM | POA: Insufficient documentation

## 2013-02-25 DIAGNOSIS — Z8742 Personal history of other diseases of the female genital tract: Secondary | ICD-10-CM | POA: Insufficient documentation

## 2013-02-25 DIAGNOSIS — F3289 Other specified depressive episodes: Secondary | ICD-10-CM | POA: Insufficient documentation

## 2013-02-25 DIAGNOSIS — F331 Major depressive disorder, recurrent, moderate: Secondary | ICD-10-CM

## 2013-02-25 DIAGNOSIS — R0789 Other chest pain: Secondary | ICD-10-CM

## 2013-02-25 DIAGNOSIS — Z79899 Other long term (current) drug therapy: Secondary | ICD-10-CM | POA: Insufficient documentation

## 2013-02-25 LAB — CBC WITH DIFFERENTIAL/PLATELET
Eosinophils Relative: 1 % (ref 0–5)
HCT: 43 % (ref 36.0–46.0)
Lymphocytes Relative: 25 % (ref 12–46)
Lymphs Abs: 2.5 10*3/uL (ref 0.7–4.0)
MCH: 29.7 pg (ref 26.0–34.0)
MCV: 86.3 fL (ref 78.0–100.0)
Monocytes Absolute: 0.5 10*3/uL (ref 0.1–1.0)
Neutro Abs: 6.6 10*3/uL (ref 1.7–7.7)
RBC: 4.98 MIL/uL (ref 3.87–5.11)
WBC: 9.8 10*3/uL (ref 4.0–10.5)

## 2013-02-25 LAB — COMPREHENSIVE METABOLIC PANEL
ALT: 16 U/L (ref 0–35)
AST: 15 U/L (ref 0–37)
CO2: 24 mEq/L (ref 19–32)
Calcium: 9.2 mg/dL (ref 8.4–10.5)
Chloride: 106 mEq/L (ref 96–112)
Creatinine, Ser: 0.64 mg/dL (ref 0.50–1.10)
GFR calc Af Amer: >90 mL/min (ref >90)
GFR calc non Af Amer: >90 mL/min (ref >90)
Glucose, Bld: 99 mg/dL (ref 70–99)
Sodium: 139 mEq/L (ref 135–145)
Total Bilirubin: 0.4 mg/dL (ref 0.3–1.2)

## 2013-02-25 MED ORDER — SODIUM CHLORIDE 0.9 % IV BOLUS (SEPSIS)
1000.0000 mL | Freq: Once | INTRAVENOUS | Status: AC
  Administered 2013-02-25: 1000 mL via INTRAVENOUS

## 2013-02-25 MED ORDER — KETOROLAC TROMETHAMINE 30 MG/ML IJ SOLN
30.0000 mg | Freq: Once | INTRAMUSCULAR | Status: AC
  Administered 2013-02-25: 30 mg via INTRAVENOUS
  Filled 2013-02-25: qty 1

## 2013-02-25 MED ORDER — ALPRAZOLAM 1 MG PO TABS: 1.0000 mg | ORAL_TABLET | Freq: Three times a day (TID) | ORAL | Status: DC | PRN

## 2013-02-25 MED ORDER — PROMETHAZINE HCL 25 MG/ML IJ SOLN
25.0000 mg | Freq: Once | INTRAMUSCULAR | Status: AC
  Administered 2013-02-25: 25 mg via INTRAVENOUS
  Filled 2013-02-25: qty 1

## 2013-02-25 NOTE — ED Provider Notes (Signed)
CSN: [id number]     Arrival date & time 02/25/13  1437 History   First MD Initiated Contact with Patient 02/25/13 1658     Chief Complaint  Patient presents with  . Chest Pain  . Headache   (Consider location/radiation/quality/duration/timing/severity/associated sxs/prior Treatment) Patient is a 42 y.o. female presenting with chest pain and headaches.  Chest Pain Associated symptoms: headache   Headache  Pt reports she was at a Mental Health appointment this morning, got home around 10:30 and at 11am began to have severe diffuse throbbing headache, similar to multiple previous and typically improved with meds at home. She began to have moderate left sided chest tightness at about the same time but no SOB. States right arm 'feels funny' but denies any left arm or neck pain, no vomiting. She usually gets Toradol and Phenergan from PCP for her headaches but their office was closed today. She has no known CAD and actually have clean cath about 18 months ago at [facility].   Past Medical History  Diagnosis Date  . Polycystic ovarian disease   . Asthma   . Anxiety   . Depression   . Chest pain     NO CAD by cath 12/12/11, nl LV fxn  . Hypertriglyceridemia   . Tobacco abuse   . Obesity   . [id number](621.3)    Past Surgical History  Procedure Laterality Date  . Cesarean section    . Ectopic pregnancy surgery    . Cholecystectomy    . Dilation and curettage of uterus    . Tubal ligation    . Cardiac catheterization     Family History  Problem Relation Age of Onset  . Depression Paternal Aunt   . Depression Paternal Uncle    History  Substance Use Topics  . Smoking status: Former Smoker -- 0.50 packs/day    Types: Cigarettes  . Smokeless tobacco: Not on file     Comment: patches  . Alcohol Use: No   OB History   Grav Para Term Preterm Abortions TAB SAB Ect Mult Living                 Review of Systems  Cardiovascular: Positive for chest pain.  Neurological: Positive for  headaches.   All other systems reviewed and are negative except as noted in HPI.   Allergies  Review of patient's allergies indicates no known allergies.  Home Medications   Current Outpatient Rx  Name  Route  Sig  Dispense  Refill  . ALPRAZolam (XANAX) 1 MG tablet   Oral   Take 1 tablet (1 mg total) by mouth 3 (three) times daily as needed for anxiety. For anxiety   90 tablet   2   . aspirin-acetaminophen-caffeine (EXCEDRIN MIGRAINE) 250-250-65 MG per tablet   Oral   Take 1 tablet by mouth every 6 (six) hours as needed for pain.         Marland Kitchen escitalopram (LEXAPRO) 20 MG tablet   Oral   Take 40 mg by mouth at bedtime.         . metFORMIN (GLUCOPHAGE) 1000 MG tablet   Oral   Take 2,000 mg by mouth at bedtime.         . topiramate (TOPAMAX) 50 MG tablet   Oral   Take 50 mg by mouth 2 (two) times daily.           BP 100/58  Pulse 73  Temp(Src) 98.3 F (36.8 C) (Oral)  Resp 14  Ht 5\' 3"  (1.6 m)  Wt 270 lb (122.471 kg)  BMI 47.84 kg/m2  SpO2 99%  LMP 02/14/2013 Physical Exam  Nursing note and vitals reviewed. Constitutional: She is oriented to person, place, and time. She appears well-developed and well-nourished.  HENT:  Head: Normocephalic and atraumatic.  Eyes: EOM are normal. Pupils are equal, round, and reactive to light.  Neck: Normal range of motion. Neck supple.  Cardiovascular: Normal rate, normal heart sounds and intact distal pulses.   Pulmonary/Chest: Effort normal and breath sounds normal. She exhibits tenderness (L upper chest).  Abdominal: Bowel sounds are normal. She exhibits no distension. There is no tenderness.  Musculoskeletal: Normal range of motion. She exhibits no edema and no tenderness.  Neurological: She is alert and oriented to person, place, and time. She has normal strength. No cranial nerve deficit or sensory deficit.  Skin: Skin is warm and dry. No rash noted.  Psychiatric: She has a normal mood and affect.    ED Course   Procedures (including critical care time) Labs Review Labs Reviewed  CBC WITH DIFFERENTIAL  COMPREHENSIVE METABOLIC PANEL  TROPONIN I   Imaging Review Dg Chest 2 View  02/25/2013   CLINICAL DATA:  Chest pain, asthma, migraine  EXAM: CHEST  2 VIEW  COMPARISON:  04/05/2012  FINDINGS: The heart size and mediastinal contours are within normal limits. Both lungs are clear. The visualized skeletal structures are unremarkable.  IMPRESSION: No active cardiopulmonary disease.   Electronically Signed   By: Esperanza Heir M.D.   On: 02/25/2013 15:05    EKG Interpretation     Ventricular Rate:  89 PR Interval:  136 QRS Duration: 92 QT Interval:  372 QTC Calculation: 452 R Axis:   -5 Text Interpretation:  Normal sinus rhythm Cannot rule out Anterior infarct , age undetermined Abnormal ECG When compared with ECG of 08-Apr-2012 05:04, Vent. rate has increased BY  31 BPM            MDM   1. Migraine headache   2. Atypical chest pain     Labs and imaging normal. No concern for ACS, she is PERC neg. Will give Toradol/Phenergan and IVF for her headache.   7:53 PM Pt feeling better and ready to go home. Sleeping soundly in her room.     Charles B. Bernette Mayers, MD 02/25/13 [id number]
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
## 2013-02-25 NOTE — ED Notes (Signed)
Chest pain, seen by MD this am.  Rt arm "feels weird".  Nausea, Headache.

## 2013-03-20 ENCOUNTER — Other Ambulatory Visit: Payer: Self-pay

## 2013-05-28 ENCOUNTER — Encounter (HOSPITAL_COMMUNITY): Payer: Self-pay | Admitting: Psychiatry

## 2013-05-28 ENCOUNTER — Ambulatory Visit (INDEPENDENT_AMBULATORY_CARE_PROVIDER_SITE_OTHER): Payer: PRIVATE HEALTH INSURANCE | Admitting: Psychiatry

## 2013-05-28 VITALS — BP 110/80 | Ht 63.0 in | Wt 262.0 lb

## 2013-05-28 DIAGNOSIS — F331 Major depressive disorder, recurrent, moderate: Secondary | ICD-10-CM

## 2013-05-28 MED ORDER — ESCITALOPRAM OXALATE 20 MG PO TABS: ORAL_TABLET | ORAL | Status: DC

## 2013-05-28 MED ORDER — ALPRAZOLAM 1 MG PO TABS: 1.0000 mg | ORAL_TABLET | Freq: Three times a day (TID) | ORAL | Status: DC | PRN
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
## 2013-06-19 ENCOUNTER — Ambulatory Visit (HOSPITAL_COMMUNITY): Payer: Self-pay | Admitting: Psychiatry

## 2013-08-27 ENCOUNTER — Encounter (HOSPITAL_COMMUNITY): Payer: Self-pay | Admitting: Psychiatry

## 2013-08-27 ENCOUNTER — Ambulatory Visit (INDEPENDENT_AMBULATORY_CARE_PROVIDER_SITE_OTHER): Payer: PRIVATE HEALTH INSURANCE | Admitting: Psychiatry

## 2013-08-27 VITALS — BP 120/88 | Ht 63.0 in | Wt 251.0 lb

## 2013-08-27 DIAGNOSIS — F329 Major depressive disorder, single episode, unspecified: Secondary | ICD-10-CM

## 2013-08-27 DIAGNOSIS — F331 Major depressive disorder, recurrent, moderate: Secondary | ICD-10-CM

## 2013-08-27 DIAGNOSIS — F411 Generalized anxiety disorder: Secondary | ICD-10-CM

## 2013-08-27 DIAGNOSIS — F17200 Nicotine dependence, unspecified, uncomplicated: Secondary | ICD-10-CM

## 2013-08-27 MED ORDER — ALPRAZOLAM 1 MG PO TABS: 1.0000 mg | ORAL_TABLET | Freq: Three times a day (TID) | ORAL | Status: DC | PRN

## 2013-08-27 MED ORDER — ESCITALOPRAM OXALATE 20 MG PO TABS: ORAL_TABLET | ORAL | Status: DC
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
## 2013-11-27 ENCOUNTER — Encounter (HOSPITAL_COMMUNITY): Payer: Self-pay | Admitting: Psychiatry

## 2013-11-27 ENCOUNTER — Telehealth (HOSPITAL_COMMUNITY): Payer: Self-pay | Admitting: *Deleted

## 2013-11-27 ENCOUNTER — Ambulatory Visit (INDEPENDENT_AMBULATORY_CARE_PROVIDER_SITE_OTHER): Payer: PRIVATE HEALTH INSURANCE | Admitting: Psychiatry

## 2013-11-27 VITALS — Ht 63.0 in | Wt 255.0 lb

## 2013-11-27 DIAGNOSIS — F331 Major depressive disorder, recurrent, moderate: Secondary | ICD-10-CM

## 2013-11-27 MED ORDER — ESCITALOPRAM OXALATE 20 MG PO TABS: ORAL_TABLET | ORAL | Status: DC

## 2013-11-27 MED ORDER — ALPRAZOLAM 1 MG PO TABS: 1.0000 mg | ORAL_TABLET | Freq: Three times a day (TID) | ORAL | Status: DC | PRN
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
## 2013-11-27 NOTE — Telephone Encounter (Signed)
Given at appt today

## 2014-02-27 ENCOUNTER — Other Ambulatory Visit: Payer: Self-pay

## 2014-03-05 ENCOUNTER — Encounter (HOSPITAL_COMMUNITY): Payer: Self-pay | Admitting: Psychiatry

## 2014-03-05 ENCOUNTER — Ambulatory Visit (INDEPENDENT_AMBULATORY_CARE_PROVIDER_SITE_OTHER): Payer: PRIVATE HEALTH INSURANCE | Admitting: Psychiatry

## 2014-03-05 VITALS — BP 112/71 | HR 85 | Ht 62.0 in | Wt 253.6 lb

## 2014-03-05 DIAGNOSIS — F331 Major depressive disorder, recurrent, moderate: Secondary | ICD-10-CM | POA: Diagnosis not present

## 2014-03-05 MED ORDER — ALPRAZOLAM 1 MG PO TABS: 1.0000 mg | ORAL_TABLET | Freq: Three times a day (TID) | ORAL | Status: DC | PRN

## 2014-03-05 MED ORDER — ESCITALOPRAM OXALATE 20 MG PO TABS: ORAL_TABLET | ORAL | Status: DC
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
## 2014-03-12 ENCOUNTER — Encounter (HOSPITAL_COMMUNITY): Payer: Self-pay | Admitting: Emergency Medicine

## 2014-03-12 ENCOUNTER — Emergency Department (HOSPITAL_COMMUNITY)
Admission: EM | Admit: 2014-03-12 | Discharge: 2014-03-12 | Disposition: A | Discharge status: Home / Self Care | Payer: PRIVATE HEALTH INSURANCE | Attending: Emergency Medicine | Admitting: Emergency Medicine

## 2014-03-12 DIAGNOSIS — F419 Anxiety disorder, unspecified: Secondary | ICD-10-CM | POA: Insufficient documentation

## 2014-03-12 DIAGNOSIS — K529 Noninfective gastroenteritis and colitis, unspecified: Secondary | ICD-10-CM | POA: Diagnosis not present

## 2014-03-12 DIAGNOSIS — Z9889 Other specified postprocedural states: Secondary | ICD-10-CM | POA: Diagnosis not present

## 2014-03-12 DIAGNOSIS — F329 Major depressive disorder, single episode, unspecified: Secondary | ICD-10-CM | POA: Insufficient documentation

## 2014-03-12 DIAGNOSIS — E669 Obesity, unspecified: Secondary | ICD-10-CM | POA: Insufficient documentation

## 2014-03-12 DIAGNOSIS — J45909 Unspecified asthma, uncomplicated: Secondary | ICD-10-CM | POA: Diagnosis not present

## 2014-03-12 DIAGNOSIS — Z79899 Other long term (current) drug therapy: Secondary | ICD-10-CM | POA: Insufficient documentation

## 2014-03-12 DIAGNOSIS — R55 Syncope and collapse: Secondary | ICD-10-CM | POA: Diagnosis present

## 2014-03-12 LAB — BASIC METABOLIC PANEL
ANION GAP: 10 (ref 5–15)
BUN: 8 mg/dL (ref 6–23)
CO2: 23 meq/L (ref 19–32)
CREATININE: 0.62 mg/dL (ref 0.50–1.10)
Calcium: 8.7 mg/dL (ref 8.4–10.5)
Chloride: 110 mEq/L (ref 96–112)
GFR calc Af Amer: >90 mL/min (ref >90)
GFR calc non Af Amer: >90 mL/min (ref >90)
Glucose, Bld: 88 mg/dL (ref 70–99)
Potassium: 3.8 mEq/L (ref 3.7–5.3)
SODIUM: 143 meq/L (ref 137–147)

## 2014-03-12 LAB — CBC WITH DIFFERENTIAL/PLATELET
Basophils Absolute: 0 10*3/uL (ref 0.0–0.1)
Basophils Relative: 1 % (ref 0–1)
Eosinophils Absolute: 0.1 10*3/uL (ref 0.0–0.7)
Eosinophils Relative: 2 % (ref 0–5)
HEMATOCRIT: 43.8 % (ref 36.0–46.0)
Hemoglobin: 14.8 g/dL (ref 12.0–15.0)
LYMPHS PCT: 21 % (ref 12–46)
Lymphs Abs: 1.8 10*3/uL (ref 0.7–4.0)
MCH: 29.6 pg (ref 26.0–34.0)
MCHC: 33.8 g/dL (ref 30.0–36.0)
MCV: 87.6 fL (ref 78.0–100.0)
MONO ABS: 0.5 10*3/uL (ref 0.1–1.0)
MONOS PCT: 6 % (ref 3–12)
NEUTROS ABS: 6.1 10*3/uL (ref 1.7–7.7)
Neutrophils Relative %: 70 % (ref 43–77)
Platelets: 189 10*3/uL (ref 150–400)
RBC: 5 MIL/uL (ref 3.87–5.11)
RDW: 13.4 % (ref 11.5–15.5)
WBC: 8.6 10*3/uL (ref 4.0–10.5)

## 2014-03-12 MED ORDER — ONDANSETRON HCL 4 MG/2ML IJ SOLN
4.0000 mg | Freq: Once | INTRAMUSCULAR | Status: AC
  Administered 2014-03-12: 4 mg via INTRAVENOUS

## 2014-03-12 MED ORDER — SODIUM CHLORIDE 0.9 % IV BOLUS (SEPSIS)
1000.0000 mL | Freq: Once | INTRAVENOUS | Status: AC
  Administered 2014-03-12: 1000 mL via INTRAVENOUS

## 2014-03-12 MED ORDER — ONDANSETRON HCL 4 MG/2ML IJ SOLN
INTRAMUSCULAR | Status: AC
  Filled 2014-03-12: qty 2

## 2014-03-12 MED ORDER — PROMETHAZINE HCL 25 MG PO TABS: 25.0000 mg | ORAL_TABLET | Freq: Four times a day (QID) | ORAL | Status: DC | PRN

## 2014-03-12 NOTE — ED Notes (Signed)
MD aware of BP
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
## 2014-03-12 NOTE — Discharge Instructions (Signed)
Clear liquids. Rest. Prescription for Phenergan 25 mg.

## 2014-03-12 NOTE — ED Notes (Signed)
C/o fever and n/v x3 days. Pt reports sitting in chair at work and had a " syncopal episode. I got really hot before I passed out. I haven't been able to keep anything down over last couple days" denies any pain

## 2014-04-23 ENCOUNTER — Encounter (HOSPITAL_COMMUNITY): Payer: Self-pay | Admitting: Cardiovascular Disease

## 2014-06-10 ENCOUNTER — Telehealth (HOSPITAL_COMMUNITY): Payer: Self-pay | Admitting: *Deleted

## 2014-06-10 NOTE — Telephone Encounter (Signed)
Please route to [place] 

## 2014-06-10 NOTE — Telephone Encounter (Signed)
PATIENT JUST SCHEDULED AN APPOINTMENT, SHE NEEDS XANAX AND LEXAPRO.   SHE USES [facility].

## 2014-06-15 ENCOUNTER — Telehealth (HOSPITAL_COMMUNITY): Payer: Self-pay | Admitting: *Deleted

## 2014-06-15 NOTE — Telephone Encounter (Signed)
Pt pharmacy requesting refills for pt Xanax 1 mg TID. Pt medication was last filled 03-05-14 with 90 tablets with 2 refills. Called pt to see if she have tablets left and per pt she is out of tablets. Pt f/u appt is scheduled for 06-24-14. Pt number is [phone]

## 2014-06-15 NOTE — Telephone Encounter (Signed)
She should have gotten 90 tablets on 05/05/14. This should last until 06/05/14. This would be the earliest date of refill

## 2014-06-16 ENCOUNTER — Telehealth (HOSPITAL_COMMUNITY): Payer: Self-pay | Admitting: *Deleted

## 2014-06-16 DIAGNOSIS — F331 Major depressive disorder, recurrent, moderate: Secondary | ICD-10-CM

## 2014-06-16 MED ORDER — ALPRAZOLAM 1 MG PO TABS: 1.0000 mg | ORAL_TABLET | Freq: Three times a day (TID) | ORAL | Status: DC | PRN

## 2014-06-16 NOTE — Telephone Encounter (Signed)
Pt called requesting refills for her Xanax due to out. Per Dr. Tenny Crawoss to only fill 30 days for pt and to inform her to still come to scheduled f/u appt. Called pt and informed her that office will be calling in her Xanax and reminded her of her appt. Pt agreed and showed understanding. Called pt pharmacy and spoke with pharmacist Trinna PostAlex and per pharmacist she will get medications ready for pt.

## 2014-06-17 NOTE — Telephone Encounter (Signed)
Per Dr. Tenny Crawoss to go ahead and call in 1 mo supply in to pt pharmacy. Called in medication to pt pharmacy. Pt is aware and shows understanding

## 2014-06-24 ENCOUNTER — Encounter (HOSPITAL_COMMUNITY): Payer: Self-pay | Admitting: Psychiatry

## 2014-06-24 ENCOUNTER — Ambulatory Visit (INDEPENDENT_AMBULATORY_CARE_PROVIDER_SITE_OTHER): Payer: PRIVATE HEALTH INSURANCE | Admitting: Psychiatry

## 2014-06-24 VITALS — BP 140/69 | HR 85 | Ht 62.0 in | Wt 262.6 lb

## 2014-06-24 DIAGNOSIS — F17200 Nicotine dependence, unspecified, uncomplicated: Secondary | ICD-10-CM

## 2014-06-24 DIAGNOSIS — F419 Anxiety disorder, unspecified: Secondary | ICD-10-CM | POA: Diagnosis not present

## 2014-06-24 DIAGNOSIS — F331 Major depressive disorder, recurrent, moderate: Secondary | ICD-10-CM | POA: Diagnosis not present

## 2014-06-24 MED ORDER — ESCITALOPRAM OXALATE 20 MG PO TABS: ORAL_TABLET | ORAL | Status: DC

## 2014-06-24 MED ORDER — ALPRAZOLAM 1 MG PO TABS: 1.0000 mg | ORAL_TABLET | Freq: Three times a day (TID) | ORAL | Status: DC | PRN
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
## 2014-09-22 ENCOUNTER — Ambulatory Visit (HOSPITAL_COMMUNITY): Payer: Self-pay | Admitting: Psychiatry

## 2014-09-30 ENCOUNTER — Ambulatory Visit (HOSPITAL_COMMUNITY): Payer: Self-pay | Admitting: Psychiatry

## 2014-10-14 ENCOUNTER — Telehealth (HOSPITAL_COMMUNITY): Payer: Self-pay | Admitting: *Deleted

## 2014-10-14 ENCOUNTER — Other Ambulatory Visit (HOSPITAL_COMMUNITY): Payer: Self-pay | Admitting: Psychiatry

## 2014-10-14 DIAGNOSIS — F331 Major depressive disorder, recurrent, moderate: Secondary | ICD-10-CM

## 2014-10-14 MED ORDER — ESCITALOPRAM OXALATE 20 MG PO TABS: ORAL_TABLET | ORAL | Status: DC

## 2014-10-14 MED ORDER — ALPRAZOLAM 1 MG PO TABS: 1.0000 mg | ORAL_TABLET | Freq: Three times a day (TID) | ORAL | Status: DC | PRN

## 2014-10-14 NOTE — Telephone Encounter (Signed)
Per pt she has f/u appt 11-03-14 and she is out of her Xanax TID and Lexapro TID. Per pt she will be out of medication tomorrow. Pt medications last filled 06-24-14. Pt number is [phone].

## 2014-10-14 NOTE — Telephone Encounter (Signed)
Pt Xanax was sent to her pharmacy and spoke with [place] at pt pharmacy

## 2014-10-14 NOTE — Telephone Encounter (Signed)
Per Dr. Tenny Crawoss to call in refills for pt Xanax and per previous phone call. Called pt pharmacy and spoke [place] and per [place] he will get script ready for pt. Called pt and lmtcb inform her that her medications where sent to her pharmacy.

## 2014-10-14 NOTE — Telephone Encounter (Signed)
lexapro sent. You may call in enough xanax to last until appt

## 2014-10-28 ENCOUNTER — Encounter (HOSPITAL_COMMUNITY): Payer: Self-pay | Admitting: Psychiatry

## 2014-10-28 ENCOUNTER — Ambulatory Visit (INDEPENDENT_AMBULATORY_CARE_PROVIDER_SITE_OTHER): Payer: PRIVATE HEALTH INSURANCE | Admitting: Psychiatry

## 2014-10-28 VITALS — BP 112/73 | HR 78 | Ht 62.0 in | Wt 268.8 lb

## 2014-10-28 DIAGNOSIS — F329 Major depressive disorder, single episode, unspecified: Secondary | ICD-10-CM

## 2014-10-28 DIAGNOSIS — F17200 Nicotine dependence, unspecified, uncomplicated: Secondary | ICD-10-CM | POA: Diagnosis not present

## 2014-10-28 DIAGNOSIS — F419 Anxiety disorder, unspecified: Secondary | ICD-10-CM | POA: Diagnosis not present

## 2014-10-28 DIAGNOSIS — F331 Major depressive disorder, recurrent, moderate: Secondary | ICD-10-CM

## 2014-10-28 MED ORDER — ALPRAZOLAM 1 MG PO TABS: 1.0000 mg | ORAL_TABLET | Freq: Three times a day (TID) | ORAL | Status: DC | PRN

## 2014-10-28 MED ORDER — ESCITALOPRAM OXALATE 20 MG PO TABS: ORAL_TABLET | ORAL | Status: DC

## 2014-10-28 MED ORDER — ZOLPIDEM TARTRATE 10 MG PO TABS: 10.0000 mg | ORAL_TABLET | Freq: Every evening | ORAL | Status: DC | PRN
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
## 2014-11-03 ENCOUNTER — Ambulatory Visit (HOSPITAL_COMMUNITY): Payer: Self-pay | Admitting: Psychiatry

## 2015-01-18 ENCOUNTER — Emergency Department (HOSPITAL_COMMUNITY)
Admission: EM | Admit: 2015-01-18 | Discharge: 2015-01-18 | Disposition: A | Discharge status: Home / Self Care | Payer: Self-pay | Attending: Emergency Medicine | Admitting: Emergency Medicine

## 2015-01-18 ENCOUNTER — Encounter (HOSPITAL_COMMUNITY): Payer: Self-pay | Admitting: Emergency Medicine

## 2015-01-18 DIAGNOSIS — F419 Anxiety disorder, unspecified: Secondary | ICD-10-CM | POA: Insufficient documentation

## 2015-01-18 DIAGNOSIS — R630 Anorexia: Secondary | ICD-10-CM | POA: Insufficient documentation

## 2015-01-18 DIAGNOSIS — Z9851 Tubal ligation status: Secondary | ICD-10-CM | POA: Insufficient documentation

## 2015-01-18 DIAGNOSIS — J45909 Unspecified asthma, uncomplicated: Secondary | ICD-10-CM | POA: Insufficient documentation

## 2015-01-18 DIAGNOSIS — F329 Major depressive disorder, single episode, unspecified: Secondary | ICD-10-CM | POA: Insufficient documentation

## 2015-01-18 DIAGNOSIS — R1013 Epigastric pain: Secondary | ICD-10-CM | POA: Insufficient documentation

## 2015-01-18 DIAGNOSIS — Z9889 Other specified postprocedural states: Secondary | ICD-10-CM | POA: Insufficient documentation

## 2015-01-18 DIAGNOSIS — R110 Nausea: Secondary | ICD-10-CM | POA: Insufficient documentation

## 2015-01-18 DIAGNOSIS — Z87891 Personal history of nicotine dependence: Secondary | ICD-10-CM | POA: Insufficient documentation

## 2015-01-18 DIAGNOSIS — Z3202 Encounter for pregnancy test, result negative: Secondary | ICD-10-CM | POA: Insufficient documentation

## 2015-01-18 DIAGNOSIS — Z79899 Other long term (current) drug therapy: Secondary | ICD-10-CM | POA: Insufficient documentation

## 2015-01-18 DIAGNOSIS — R55 Syncope and collapse: Secondary | ICD-10-CM | POA: Insufficient documentation

## 2015-01-18 LAB — URINALYSIS, ROUTINE W REFLEX MICROSCOPIC
Bilirubin Urine: NEGATIVE
Glucose, UA: NEGATIVE mg/dL
Ketones, ur: 15 mg/dL — AB
Leukocytes, UA: NEGATIVE
NITRITE: NEGATIVE
PH: 7 (ref 5.0–8.0)
Protein, ur: NEGATIVE mg/dL
SPECIFIC GRAVITY, URINE: 1.01 (ref 1.005–1.030)
UROBILINOGEN UA: 0.2 mg/dL (ref 0.0–1.0)

## 2015-01-18 LAB — POC URINE PREG, ED: PREG TEST UR: NEGATIVE

## 2015-01-18 LAB — COMPREHENSIVE METABOLIC PANEL
ALBUMIN: 3.5 g/dL (ref 3.5–5.0)
ALK PHOS: 92 U/L (ref 38–126)
ALT: 22 U/L (ref 14–54)
ANION GAP: 6 (ref 5–15)
AST: 21 U/L (ref 15–41)
BILIRUBIN TOTAL: 0.7 mg/dL (ref 0.3–1.2)
BUN: 12 mg/dL (ref 6–20)
CALCIUM: 8.2 mg/dL — AB (ref 8.9–10.3)
CO2: 24 mmol/L (ref 22–32)
Chloride: 108 mmol/L (ref 101–111)
Creatinine, Ser: 0.53 mg/dL (ref 0.44–1.00)
GFR calc Af Amer: >60 mL/min (ref >60)
GFR calc non Af Amer: >60 mL/min (ref >60)
GLUCOSE: 102 mg/dL — AB (ref 65–99)
POTASSIUM: 3.5 mmol/L (ref 3.5–5.1)
SODIUM: 138 mmol/L (ref 135–145)
Total Protein: 6.4 g/dL — ABNORMAL LOW (ref 6.5–8.1)

## 2015-01-18 LAB — CBC WITH DIFFERENTIAL/PLATELET
BASOS ABS: 0 10*3/uL (ref 0.0–0.1)
BASOS PCT: 0 % (ref 0–1)
EOS ABS: 0.2 10*3/uL (ref 0.0–0.7)
Eosinophils Relative: 2 % (ref 0–5)
HEMATOCRIT: 42.7 % (ref 36.0–46.0)
HEMOGLOBIN: 15 g/dL (ref 12.0–15.0)
Lymphocytes Relative: 18 % (ref 12–46)
Lymphs Abs: 2 10*3/uL (ref 0.7–4.0)
MCH: 30.5 pg (ref 26.0–34.0)
MCHC: 35.1 g/dL (ref 30.0–36.0)
MCV: 86.8 fL (ref 78.0–100.0)
MONOS PCT: 7 % (ref 3–12)
Monocytes Absolute: 0.8 10*3/uL (ref 0.1–1.0)
NEUTROS ABS: 8.1 10*3/uL — AB (ref 1.7–7.7)
NEUTROS PCT: 73 % (ref 43–77)
Platelets: 183 10*3/uL (ref 150–400)
RBC: 4.92 MIL/uL (ref 3.87–5.11)
RDW: 13.1 % (ref 11.5–15.5)
WBC: 11.1 10*3/uL — AB (ref 4.0–10.5)

## 2015-01-18 LAB — LIPASE, BLOOD: Lipase: 25 U/L (ref 22–51)

## 2015-01-18 LAB — CBG MONITORING, ED: GLUCOSE-CAPILLARY: 117 mg/dL — AB (ref 65–99)

## 2015-01-18 LAB — URINE MICROSCOPIC-ADD ON

## 2015-01-18 MED ORDER — ALUM & MAG HYDROXIDE-SIMETH 200-200-20 MG/5ML PO SUSP
15.0000 mL | Freq: Once | ORAL | Status: AC
  Administered 2015-01-18: 15 mL via ORAL
  Filled 2015-01-18: qty 30

## 2015-01-18 MED ORDER — LIDOCAINE VISCOUS 2 % MT SOLN
15.0000 mL | Freq: Once | OROMUCOSAL | Status: AC
  Administered 2015-01-18: 15 mL via OROMUCOSAL
  Filled 2015-01-18: qty 15

## 2015-01-18 MED ORDER — ONDANSETRON 4 MG PO TBDP: ORAL_TABLET | ORAL | Status: DC

## 2015-01-18 MED ORDER — ONDANSETRON HCL 4 MG/2ML IJ SOLN
4.0000 mg | Freq: Once | INTRAMUSCULAR | Status: AC
  Administered 2015-01-18: 4 mg via INTRAVENOUS
  Filled 2015-01-18: qty 2

## 2015-01-18 MED ORDER — SODIUM CHLORIDE 0.9 % IV BOLUS (SEPSIS)
1000.0000 mL | Freq: Once | INTRAVENOUS | Status: AC
  Administered 2015-01-18: 1000 mL via INTRAVENOUS

## 2015-01-18 NOTE — ED Provider Notes (Signed)
CSN: [id number]     Arrival date & time 01/18/15  0724 History   First MD Initiated Contact with Patient 01/18/15 [phone]     Chief Complaint  Patient presents with  . Loss of Consciousness     (Consider location/radiation/quality/duration/timing/severity/associated sxs/prior Treatment) Patient is a 44 y.o. female presenting with abdominal pain. The history is provided by the patient.  Abdominal Pain Pain location:  Epigastric Pain quality: sharp   Pain radiates to:  Does not radiate Pain severity:  Moderate Onset quality:  Gradual Duration:  2 days Timing:  Constant Progression:  Unchanged Chronicity:  New Relieved by:  Nothing Worsened by:  Nothing tried Ineffective treatments:  None tried Associated symptoms: anorexia   Associated symptoms: no chest pain, no chills, no dysuria, no fever, no nausea, no shortness of breath and no vomiting   Risk factors: obesity    44 yo F with a chief complaint of epigastric abdominal pain. This been going on for the past couple days. Patient denies any fevers or chills. Has some nausea but denies vomiting. Unsure what makes it better or worse. Patient has not felt like eating for the past couple days.  Not really eating and drinking. 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 Unsure what exactly happened but was found on the ground. Patient denies any chest pain shortness breath headache. Continues to have epigastric abdominal pain. Described as sharp, nonradiating. Denies vaginal discharge vaginal bleeding.  Past Medical History  Diagnosis Date  . Polycystic ovarian disease   . Asthma   . Anxiety   . Depression   . Chest pain     NO CAD by cath 12/12/11, nl LV fxn  . Hypertriglyceridemia   . Tobacco abuse   . Obesity   . [id number](956.2)    Past Surgical History  Procedure Laterality Date  . Cesarean section    . Ectopic pregnancy surgery    . Cholecystectomy    . Dilation and curettage of uterus    . Tubal ligation    . Cardiac  catheterization    . Left heart catheterization with coronary angiogram N/A 12/12/2011    Procedure: LEFT HEART CATHETERIZATION WITH CORONARY ANGIOGRAM;  Surgeon: Tonny Bollman, MD;  Location: [facility] CATH LAB;  Service: Cardiovascular;  Laterality: N/A;   Family History  Problem Relation Age of Onset  . Depression Paternal Aunt   . Depression Paternal Uncle    Social History  Substance Use Topics  . Smoking status: Former Smoker -- 0.50 packs/day    Types: Cigarettes  . Smokeless tobacco: None     Comment: patches  . Alcohol Use: No   OB History    No data available     Review of Systems  Constitutional: Negative for fever and chills.  HENT: Negative for congestion and rhinorrhea.   Eyes: Negative for redness and visual disturbance.  Respiratory: Negative for shortness of breath and wheezing.   Cardiovascular: Negative for chest pain and palpitations.  Gastrointestinal: Positive for abdominal pain and anorexia. Negative for nausea and vomiting.  Genitourinary: Negative for dysuria and urgency.  Musculoskeletal: Negative for myalgias and arthralgias.  Skin: Negative for pallor and wound.  Neurological: Positive for dizziness and syncope. Negative for headaches.      Allergies  Review of patient's allergies indicates no known allergies.  Home Medications   Prior to Admission medications   Medication Sig Start Date End Date Taking? Authorizing Provider  ALPRAZolam Prudy Feeler) 1 MG tablet Take 1 tablet (1 mg total)  by mouth 3 (three) times daily as needed for anxiety. For anxiety 10/14/14   Myrlene Broker, MD  ALPRAZolam Prudy Feeler) 1 MG tablet Take 1 tablet (1 mg total) by mouth 3 (three) times daily as needed for anxiety. For anxiety 10/28/14   Myrlene Broker, MD  ALPRAZolam Prudy Feeler) 1 MG tablet Take 1 tablet (1 mg total) by mouth 3 (three) times daily as needed for anxiety. For anxiety 10/28/14   Myrlene Broker, MD  aspirin-acetaminophen-caffeine [facility] MIGRAINE) [phone] MG per  tablet Take 1 tablet by mouth every 6 (six) hours as needed for pain.    Historical Provider, MD  escitalopram (LEXAPRO) 20 MG tablet Take one in the am and 2 at bedtime 10/28/14   Myrlene Broker, MD  metFORMIN (GLUCOPHAGE) 1000 MG tablet Take 2,000 mg by mouth at bedtime.    Historical Provider, MD  ondansetron (ZOFRAN ODT) 4 MG disintegrating tablet  ODT q4 hours prn nausea/vomit 01/18/15   Melene Plan, DO  promethazine (PHENERGAN) 25 MG tablet Take 1 tablet (25 mg total) by mouth every 6 (six) hours as needed. 03/12/14   Donnetta Hutching, MD  topiramate (TOPAMAX) 50 MG tablet Take 50 mg by mouth 2 (two) times daily.     Historical Provider, MD  zolpidem (AMBIEN) 10 MG tablet Take 1 tablet (10 mg total) by mouth at bedtime as needed for sleep. 10/28/14 11/27/14  Myrlene Broker, MD   BP 99/51 mmHg  Pulse 76  Temp(Src) 97.9 F (36.6 C) (Oral)  Resp 18  Ht  (1.6 m)  Wt 260 lb (117.935 kg)  BMI 46.07 kg/m2  SpO2 94%  LMP 12/23/2014 Physical Exam  Constitutional: She is oriented to person, place, and time. She appears well-developed and well-nourished. No distress.  Morbidly obese  HENT:  Head: Normocephalic and atraumatic.  Eyes: EOM are normal. Pupils are equal, round, and reactive to light.  Neck: Normal range of motion. Neck supple.  Cardiovascular: Normal rate and regular rhythm.  Exam reveals no gallop and no friction rub.   No murmur heard. Pulmonary/Chest: Effort normal. She has no wheezes. She has no rales.  Abdominal: Soft. She exhibits no distension. There is no tenderness. There is no rebound and no guarding.  Benign abdomen  Musculoskeletal: She exhibits no edema or tenderness.  Neurological: She is alert and oriented to person, place, and time.  Skin: Skin is warm and dry. She is not diaphoretic.  Psychiatric: She has a normal mood and affect. Her behavior is normal.    ED Course  Procedures (including critical care time) Labs Review Labs Reviewed  CBC WITH  DIFFERENTIAL/PLATELET - Abnormal; Notable for the following:    WBC 11.1 (*)    Neutro Abs 8.1 (*)    All other components within normal limits  COMPREHENSIVE METABOLIC PANEL - Abnormal; Notable for the following:    Glucose, Bld 102 (*)    Calcium 8.2 (*)    Total Protein 6.4 (*)    All other components within normal limits  URINALYSIS, ROUTINE W REFLEX MICROSCOPIC (NOT AT [facility]) - Abnormal; Notable for the following:    Hgb urine dipstick TRACE (*)    Ketones, ur 15 (*)    All other components within normal limits  URINE MICROSCOPIC-ADD ON - Abnormal; Notable for the following:    Squamous Epithelial / LPF FEW (*)    All other components within normal limits  CBG MONITORING, ED - Abnormal; Notable for the following:    Glucose-Capillary  117 (*)    All other components within normal limits  LIPASE, BLOOD  POC URINE PREG, ED    Imaging Review No results found. I have personally reviewed and evaluated these images and lab results as part of my medical decision-making.   EKG Interpretation   Date/Time:  Monday January 18 2015 07:29:17 EDT Ventricular Rate:  73 PR Interval:  153 QRS Duration: 99 QT Interval:  416 QTC Calculation: 458 R Axis:   -11 Text Interpretation:  Sinus rhythm Nonspecific T abnormalities, anterior  leads now wpw, brugada or prolonged qt No significant change since last  tracing Confirmed by Charlii Yost MD, DANIEL ([id number]) on 01/18/2015 7:38:39 AM      MDM   Final diagnoses:  Epigastric abdominal pain  Syncope and collapse    44 yo F with a chief complaint of epigastric abdominal pain. Patient also had a syncopal event. Patient had multiple these in the past is actually been hospitalized couple different times and had a cardiac cath about 3 years ago for this. Multiple workups negative patient with a significant psychiatric history. Though, possibly psychogenic in nature. Patient not really eating or drinking in the past couple days. Could be secondary to  hypovolemia or hypoglycemia. Benign abdominal exam. With diminished intake will check a CBC CMP lipase. EKG performed and unchanged.  Laboratory evaluation unremarkable. Patient feeling symptomatically better after Reglan and fluids. Discharge patient home with Reglan prescription for possible gastroparesis.  3:12 PM:  I have discussed the diagnosis/risks/treatment options with the patient and family and believe the pt to be eligible for discharge home to follow-up with PCP. We also discussed returning to the ED immediately if new or worsening sx occur. We discussed the sx which are most concerning (e.g., worsening abdominal pain) that necessitate immediate return. Medications administered to the patient during their visit and any new prescriptions provided to the patient are listed below.  Medications given during this visit Medications  sodium chloride 0.9 % bolus 1,000 mL (0 mLs Intravenous Stopped 01/18/15 0911)  ondansetron (ZOFRAN) injection 4 mg (4 mg Intravenous Given 01/18/15 0750)  alum & mag hydroxide-simeth (MAALOX/MYLANTA) 200-200-20 MG/5ML suspension 15 mL (15 mLs Oral Given 01/18/15 0750)  lidocaine (XYLOCAINE) 2 % viscous mouth solution 15 mL (15 mLs Mouth/Throat Given 01/18/15 0750)    Discharge Medication List as of 01/18/2015  8:48 AM    START taking these medications   Details  ondansetron (ZOFRAN ODT) 4 MG disintegrating tablet 4mg  ODT q4 hours prn nausea/vomit, Print         The patient appears reasonably screen and/or stabilized for discharge and I doubt any other medical condition or other [facility] requiring further screening, evaluation, or treatment in the ED at this time prior to discharge.    Melene Plan, DO 01/18/15 1526

## 2015-01-18 NOTE — Discharge Instructions (Signed)

## 2015-01-18 NOTE — ED Notes (Addendum)
Pt c/o dizziness and abd pain/nausea since 3am. Pt states she was passing out medication and woke up in floor. Pt found on floor by coworkers. Denies head/neck/back pain.  cbg 121 per EMS.

## 2015-01-20 ENCOUNTER — Ambulatory Visit (INDEPENDENT_AMBULATORY_CARE_PROVIDER_SITE_OTHER): Payer: PRIVATE HEALTH INSURANCE | Admitting: Psychiatry

## 2015-01-20 ENCOUNTER — Encounter (HOSPITAL_COMMUNITY): Payer: Self-pay | Admitting: Psychiatry

## 2015-01-20 VITALS — BP 110/63 | HR 86 | Ht 63.0 in | Wt 275.0 lb

## 2015-01-20 DIAGNOSIS — F419 Anxiety disorder, unspecified: Secondary | ICD-10-CM | POA: Diagnosis not present

## 2015-01-20 DIAGNOSIS — F329 Major depressive disorder, single episode, unspecified: Secondary | ICD-10-CM | POA: Diagnosis not present

## 2015-01-20 DIAGNOSIS — F17200 Nicotine dependence, unspecified, uncomplicated: Secondary | ICD-10-CM

## 2015-01-20 DIAGNOSIS — F331 Major depressive disorder, recurrent, moderate: Secondary | ICD-10-CM

## 2015-01-20 MED ORDER — ZOLPIDEM TARTRATE 10 MG PO TABS: 10.0000 mg | ORAL_TABLET | Freq: Every evening | ORAL | Status: DC | PRN

## 2015-01-20 MED ORDER — ESCITALOPRAM OXALATE 20 MG PO TABS: ORAL_TABLET | ORAL | Status: DC

## 2015-01-20 MED ORDER — ALPRAZOLAM 1 MG PO TABS: 1.0000 mg | ORAL_TABLET | Freq: Three times a day (TID) | ORAL | Status: DC | PRN
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
## 2015-01-28 ENCOUNTER — Ambulatory Visit (HOSPITAL_COMMUNITY): Payer: Self-pay | Admitting: Psychiatry

## 2015-04-21 ENCOUNTER — Ambulatory Visit (INDEPENDENT_AMBULATORY_CARE_PROVIDER_SITE_OTHER): Payer: PRIVATE HEALTH INSURANCE | Admitting: Psychiatry

## 2015-04-21 ENCOUNTER — Encounter (HOSPITAL_COMMUNITY): Payer: Self-pay | Admitting: Psychiatry

## 2015-04-21 VITALS — BP 116/84 | HR 89 | Ht 63.0 in | Wt 289.4 lb

## 2015-04-21 DIAGNOSIS — F17200 Nicotine dependence, unspecified, uncomplicated: Secondary | ICD-10-CM

## 2015-04-21 DIAGNOSIS — F419 Anxiety disorder, unspecified: Secondary | ICD-10-CM

## 2015-04-21 DIAGNOSIS — F331 Major depressive disorder, recurrent, moderate: Secondary | ICD-10-CM | POA: Diagnosis not present

## 2015-04-21 MED ORDER — ALPRAZOLAM 1 MG PO TABS: 1.0000 mg | ORAL_TABLET | Freq: Three times a day (TID) | ORAL | Status: DC | PRN

## 2015-04-21 MED ORDER — ZOLPIDEM TARTRATE 10 MG PO TABS: 10.0000 mg | ORAL_TABLET | Freq: Every evening | ORAL | Status: DC | PRN

## 2015-04-21 MED ORDER — VILAZODONE HCL 40 MG PO TABS: 40.0000 mg | ORAL_TABLET | Freq: Every day | ORAL | Status: DC
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
## 2015-06-01 ENCOUNTER — Telehealth (HOSPITAL_COMMUNITY): Payer: Self-pay | Admitting: *Deleted

## 2015-06-01 ENCOUNTER — Ambulatory Visit (HOSPITAL_COMMUNITY): Payer: Self-pay | Admitting: Psychiatry

## 2015-06-02 ENCOUNTER — Telehealth (HOSPITAL_COMMUNITY): Payer: Self-pay | Admitting: *Deleted

## 2015-07-19 ENCOUNTER — Telehealth (HOSPITAL_COMMUNITY): Payer: Self-pay | Admitting: *Deleted

## 2015-07-19 NOTE — Telephone Encounter (Signed)
patient scheduled an appointment today and would like enough Xanax and Ambien until she can be seen on 08/04/15.

## 2015-07-20 ENCOUNTER — Telehealth (HOSPITAL_COMMUNITY): Payer: Self-pay | Admitting: *Deleted

## 2015-07-20 NOTE — Telephone Encounter (Signed)
Message sent to provider 

## 2015-07-20 NOTE — Telephone Encounter (Signed)
Pt called to resch appt. Pt f/u appt is scheduled for 08-04-15. Per pt she is out of her Xanax 1 mg TID PRN and Ambien 10 mg QHS. Pt Xanax last printed 04-21-15 with 2 refills and Ambien last filled on 04-21-2015 with 2 refills. Pt number [phone].

## 2015-07-21 ENCOUNTER — Other Ambulatory Visit (HOSPITAL_COMMUNITY): Payer: Self-pay | Admitting: Psychiatry

## 2015-07-21 DIAGNOSIS — F331 Major depressive disorder, recurrent, moderate: Secondary | ICD-10-CM

## 2015-07-21 MED ORDER — ALPRAZOLAM 1 MG PO TABS: 1.0000 mg | ORAL_TABLET | Freq: Three times a day (TID) | ORAL | Status: DC | PRN

## 2015-07-21 MED ORDER — ZOLPIDEM TARTRATE 10 MG PO TABS: 10.0000 mg | ORAL_TABLET | Freq: Every evening | ORAL | Status: DC | PRN

## 2015-07-21 NOTE — Telephone Encounter (Signed)
Pt is aware and shows understanding printed script is ready for pick up

## 2015-07-21 NOTE — Telephone Encounter (Signed)
printed

## 2015-07-22 ENCOUNTER — Encounter (HOSPITAL_COMMUNITY): Payer: Self-pay | Admitting: *Deleted

## 2015-07-22 NOTE — Telephone Encounter (Signed)
Pt came into office to pick up printed script. Pt showed understanding

## 2015-07-22 NOTE — Progress Notes (Signed)
Pt came into office to pick up pritned script for her Ambien and Xanax. Pt D/L number is [id number] and expiration date 06-05-17. Pt showed understanding.

## 2015-08-04 ENCOUNTER — Ambulatory Visit (INDEPENDENT_AMBULATORY_CARE_PROVIDER_SITE_OTHER): Payer: No Typology Code available for payment source | Admitting: Psychiatry

## 2015-08-04 ENCOUNTER — Encounter (HOSPITAL_COMMUNITY): Payer: Self-pay | Admitting: Psychiatry

## 2015-08-04 VITALS — Wt 288.0 lb

## 2015-08-04 DIAGNOSIS — F331 Major depressive disorder, recurrent, moderate: Secondary | ICD-10-CM

## 2015-08-04 MED ORDER — ZOLPIDEM TARTRATE 10 MG PO TABS: 10.0000 mg | ORAL_TABLET | Freq: Every evening | ORAL | Status: DC | PRN

## 2015-08-04 MED ORDER — ALPRAZOLAM 1 MG PO TABS: 1.0000 mg | ORAL_TABLET | Freq: Three times a day (TID) | ORAL | Status: DC | PRN

## 2015-08-04 MED ORDER — VILAZODONE HCL 40 MG PO TABS: 40.0000 mg | ORAL_TABLET | Freq: Every day | ORAL | Status: DC
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
## 2015-09-27 ENCOUNTER — Emergency Department (HOSPITAL_COMMUNITY)
Admission: EM | Admit: 2015-09-27 | Discharge: 2015-09-27 | Disposition: A | Discharge status: Home / Self Care | Payer: PRIVATE HEALTH INSURANCE | Attending: Emergency Medicine | Admitting: Emergency Medicine

## 2015-09-27 ENCOUNTER — Encounter (HOSPITAL_COMMUNITY): Payer: Self-pay

## 2015-09-27 DIAGNOSIS — L03115 Cellulitis of right lower limb: Secondary | ICD-10-CM | POA: Insufficient documentation

## 2015-09-27 DIAGNOSIS — F329 Major depressive disorder, single episode, unspecified: Secondary | ICD-10-CM | POA: Insufficient documentation

## 2015-09-27 DIAGNOSIS — Z87891 Personal history of nicotine dependence: Secondary | ICD-10-CM | POA: Diagnosis not present

## 2015-09-27 DIAGNOSIS — Z6841 Body Mass Index (BMI) 40.0 and over, adult: Secondary | ICD-10-CM | POA: Diagnosis not present

## 2015-09-27 DIAGNOSIS — Z7982 Long term (current) use of aspirin: Secondary | ICD-10-CM | POA: Diagnosis not present

## 2015-09-27 DIAGNOSIS — J45909 Unspecified asthma, uncomplicated: Secondary | ICD-10-CM | POA: Diagnosis not present

## 2015-09-27 DIAGNOSIS — E669 Obesity, unspecified: Secondary | ICD-10-CM | POA: Insufficient documentation

## 2015-09-27 DIAGNOSIS — R21 Rash and other nonspecific skin eruption: Secondary | ICD-10-CM | POA: Diagnosis present

## 2015-09-27 MED ORDER — DOXYCYCLINE HYCLATE 100 MG PO CAPS: 100.0000 mg | ORAL_CAPSULE | Freq: Two times a day (BID) | ORAL | Status: DC

## 2015-09-27 MED ORDER — LORATADINE 10 MG PO TABS: 10.0000 mg | ORAL_TABLET | Freq: Every day | ORAL | Status: DC

## 2015-09-27 NOTE — Discharge Instructions (Signed)
You may take Benadryl in addition to the other medications if needed for itching or if you have trouble sleeping at night.  If the redness goes outside the lines you will need to return  Cellulitis Cellulitis is an infection of the skin and the tissue under the skin. The infected area is usually red and tender. This happens most often in the arms and lower legs. HOME CARE   Take your antibiotic medicine as told. Finish the medicine even if you start to feel better.  Keep the infected arm or leg raised (elevated).  Put a warm cloth on the area up to 4 times per day.  Only take medicines as told by your doctor.  Keep all doctor visits as told. GET HELP IF:  You see red streaks on the skin coming from the infected area.  Your red area gets bigger or turns a dark color.  Your bone or joint under the infected area is painful after the skin heals.  Your infection comes back in the same area or different area.  You have a puffy (swollen) bump in the infected area.  You have new symptoms.  You have a fever. GET HELP RIGHT AWAY IF:   You feel very sleepy.  You throw up (vomit) or have watery poop (diarrhea).  You feel sick and have muscle aches and pains.   This information is not intended to replace advice given to you by your health care provider. Make sure you discuss any questions you have with your health care provider.   Document Released: 10/18/2007 Document Revised: 01/20/2015 Document Reviewed: 07/17/2011 Elsevier Interactive Patient Education [employer]2016 Elsevier Inc.

## 2015-09-27 NOTE — ED Notes (Signed)
Pt reports she had a small red area on right upper inner thigh. Area has increased in redness and warmth noted

## 2015-09-27 NOTE — ED Notes (Signed)
Patient given discharge instruction, verbalized understand. Patient ambulatory out of the department.  

## 2015-09-27 NOTE — ED Provider Notes (Signed)
CSN: [id number]     Arrival date & time 09/27/15  1628 History  By signing my name below, I, Placido Sou, attest that this documentation has been prepared under the direction and in the presence of Kerrie Buffalo, NP. Electronically Signed: Placido Sou, ED Scribe. 09/27/2015. 5:25 PM.   Chief Complaint  Patient presents with  . Insect Bite   Patient is a 45 y.o. female presenting with rash. The history is provided by the patient. No language interpreter was used.  Rash Location:  Leg Leg rash location:  R upper leg Quality: redness   Severity:  Moderate Onset quality:  Gradual Duration:  2 days Timing:  Constant Progression:  Worsening Chronicity:  New Relieved by:  Nothing Worsened by:  Nothing tried Ineffective treatments: Tylenol. Associated symptoms: no fever     HPI Comments: Natalie Walker is a 45 y.o. female who presents to the Emergency Department due to a point of worsening redness and irritation to her right medial thigh x 2 days. Pt is unsure if she was bit by an insect stating that her symptoms have worsened since onset and developed a hardened central region. She reports mild pain and warmth to the region. She has taken Tylenol for her symptoms without significant relief. Pt is unsure of the timing of her last tetanus vaccination. She denies any possibility of pregnancy. Pt denies fevers, chills or any other associated symptoms at this time.   Past Medical History  Diagnosis Date  . Polycystic ovarian disease   . Asthma   . Anxiety   . Depression   . Chest pain     NO CAD by cath 12/12/11, nl LV fxn  . Hypertriglyceridemia   . Tobacco abuse   . Obesity   . NWGNFAOZ(308.6)    Past Surgical History  Procedure Laterality Date  . Cesarean section    . Ectopic pregnancy surgery    . Cholecystectomy    . Dilation and curettage of uterus    . Tubal ligation    . Cardiac catheterization    . Left heart catheterization with coronary angiogram N/A 12/12/2011   Procedure: LEFT HEART CATHETERIZATION WITH CORONARY ANGIOGRAM;  Surgeon: Tonny Bollman, MD;  Location: [facility] CATH LAB;  Service: Cardiovascular;  Laterality: N/A;   Family History  Problem Relation Age of Onset  . Depression Paternal Aunt   . Depression Paternal Uncle    Social History  Substance Use Topics  . Smoking status: Former Smoker -- 0.50 packs/day    Types: Cigarettes  . Smokeless tobacco: None     Comment: patches  . Alcohol Use: No   OB History    No data available     Review of Systems  Constitutional: Negative for fever and chills.  Skin: Positive for color change and rash.  All other systems reviewed and are negative.  Allergies  Review of patient's allergies indicates no known allergies.  Home Medications   Prior to Admission medications   Medication Sig Start Date End Date Taking? Authorizing Provider  ALPRAZolam Prudy Feeler) 1 MG tablet Take 1 tablet (1 mg total) by mouth 3 (three) times daily as needed for anxiety. For anxiety 08/04/15   Myrlene Broker, MD  aspirin-acetaminophen-caffeine (EXCEDRIN MIGRAINE) [phone] MG per tablet Take 1 tablet by mouth every 6 (six) hours as needed for pain.    Historical Provider, MD  doxycycline (VIBRAMYCIN) 100 MG capsule Take 1 capsule (100 mg total) by mouth 2 (two) times daily. 09/27/15   Dessire Grimes M  Damian LeavellNeese, NP  loratadine (CLARITIN) 10 MG tablet Take 1 tablet (10 mg total) by mouth daily. 09/27/15   Steffen Hase Orlene OchM Cleon Signorelli, NP  metFORMIN (GLUCOPHAGE) 1000 MG tablet Take 2,000 mg by mouth at bedtime.    Historical Provider, MD  ondansetron (ZOFRAN ODT) 4 MG disintegrating tablet 4mg  ODT q4 hours prn nausea/vomit 01/18/15   Melene Planan Floyd, DO  promethazine (PHENERGAN) 25 MG tablet Take 1 tablet (25 mg total) by mouth every 6 (six) hours as needed. 03/12/14   Donnetta HutchingBrian Cook, MD  topiramate (TOPAMAX) 50 MG tablet Take 50 mg by mouth 2 (two) times daily.     Historical Provider, MD  Vilazodone HCl (VIIBRYD) 40 MG TABS Take 1 tablet (40 mg total) by mouth  daily. 08/04/15   Myrlene Brokereborah R Ross, MD  zolpidem (AMBIEN) 10 MG tablet Take 1 tablet (10 mg total) by mouth at bedtime as needed for sleep. 08/04/15 11/03/15  Myrlene Brokereborah R Ross, MD   BP 131/106 mmHg  Pulse 112  Temp(Src) 98.2 F (36.8 C) (Oral)  Resp 20  Ht 5\' 3"  (1.6 m)  Wt 123.378 kg  BMI 48.19 kg/m2  SpO2 99%  LMP 09/02/2015    Physical Exam  Constitutional: She is oriented to person, place, and time. She appears well-developed and well-nourished.  HENT:  Head: Normocephalic and atraumatic.  Eyes: EOM are normal.  Neck: Normal range of motion.  Cardiovascular: Normal rate.   Pulmonary/Chest: Effort normal. No respiratory distress.  Abdominal: Soft.  Musculoskeletal: Normal range of motion.  Neurological: She is alert and oriented to person, place, and time.  Skin: Skin is warm and dry. There is erythema.  19 cm x 10 cm erythematous region with a raised tender area with a small black center. Firm area surrounding black center is 2 cm and is firm and tender.   Psychiatric: She has a normal mood and affect.  Nursing note and vitals reviewed.  ED Course  Procedures  DIAGNOSTIC STUDIES: Oxygen Saturation is 99% on RA, normal by my interpretation.    COORDINATION OF CARE: 5:25 PM Discussed next steps with pt. She verbalized understanding and is agreeable with the plan.   MDM  45 y.o. female with redness and tenderness to the right upper inner thigh stable for d/c without red streaking, fever and does not appear toxic. Will treat with antibiotics. Boarders marked and patient will return for worsening symptoms.   Final diagnoses:  Cellulitis of right leg   I personally performed the services described in this documentation, which was scribed in my presence. The recorded information has been reviewed and is accurate.   [address] Golf DriveHope PinevilleM Jamielee Mchale, NP 09/29/15 1552  Bethann BerkshireJoseph Zammit, MD 10/04/15 [phone]

## 2015-12-21 ENCOUNTER — Ambulatory Visit (INDEPENDENT_AMBULATORY_CARE_PROVIDER_SITE_OTHER): Payer: BLUE CROSS/BLUE SHIELD | Admitting: Psychiatry

## 2015-12-21 ENCOUNTER — Encounter (HOSPITAL_COMMUNITY): Payer: Self-pay | Admitting: Psychiatry

## 2015-12-21 VITALS — BP 115/61 | HR 84 | Ht 63.0 in | Wt 278.8 lb

## 2015-12-21 DIAGNOSIS — F419 Anxiety disorder, unspecified: Secondary | ICD-10-CM

## 2015-12-21 DIAGNOSIS — F331 Major depressive disorder, recurrent, moderate: Secondary | ICD-10-CM | POA: Diagnosis not present

## 2015-12-21 DIAGNOSIS — F17200 Nicotine dependence, unspecified, uncomplicated: Secondary | ICD-10-CM

## 2015-12-21 MED ORDER — ZOLPIDEM TARTRATE 10 MG PO TABS: 10.0000 mg | ORAL_TABLET | Freq: Every evening | ORAL | 3 refills | Status: DC | PRN

## 2015-12-21 MED ORDER — ESCITALOPRAM OXALATE 20 MG PO TABS
ORAL_TABLET | ORAL | 3 refills | Status: DC
Start: 2015-12-21 — End: 2016-04-27

## 2015-12-21 MED ORDER — ALPRAZOLAM 1 MG PO TABS: 1.0000 mg | ORAL_TABLET | Freq: Three times a day (TID) | ORAL | 3 refills | Status: DC | PRN
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
## 2016-02-03 DIAGNOSIS — Z0000 Encounter for general adult medical examination without abnormal findings: Secondary | ICD-10-CM | POA: Diagnosis not present

## 2016-04-21 ENCOUNTER — Emergency Department (HOSPITAL_COMMUNITY): Payer: BLUE CROSS/BLUE SHIELD

## 2016-04-21 ENCOUNTER — Emergency Department (HOSPITAL_COMMUNITY)
Admission: EM | Admit: 2016-04-21 | Discharge: 2016-04-21 | Disposition: A | Discharge status: Home / Self Care | Payer: BLUE CROSS/BLUE SHIELD | Attending: Emergency Medicine | Admitting: Emergency Medicine

## 2016-04-21 ENCOUNTER — Encounter (HOSPITAL_COMMUNITY): Payer: Self-pay | Admitting: Emergency Medicine

## 2016-04-21 DIAGNOSIS — Y92410 Unspecified street and highway as the place of occurrence of the external cause: Secondary | ICD-10-CM | POA: Diagnosis not present

## 2016-04-21 DIAGNOSIS — Y999 Unspecified external cause status: Secondary | ICD-10-CM | POA: Insufficient documentation

## 2016-04-21 DIAGNOSIS — S299XXA Unspecified injury of thorax, initial encounter: Secondary | ICD-10-CM | POA: Diagnosis present

## 2016-04-21 DIAGNOSIS — Z79899 Other long term (current) drug therapy: Secondary | ICD-10-CM | POA: Insufficient documentation

## 2016-04-21 DIAGNOSIS — Y9389 Activity, other specified: Secondary | ICD-10-CM | POA: Insufficient documentation

## 2016-04-21 DIAGNOSIS — F17210 Nicotine dependence, cigarettes, uncomplicated: Secondary | ICD-10-CM | POA: Insufficient documentation

## 2016-04-21 DIAGNOSIS — R0602 Shortness of breath: Secondary | ICD-10-CM | POA: Diagnosis not present

## 2016-04-21 DIAGNOSIS — J45909 Unspecified asthma, uncomplicated: Secondary | ICD-10-CM | POA: Insufficient documentation

## 2016-04-21 DIAGNOSIS — Z7984 Long term (current) use of oral hypoglycemic drugs: Secondary | ICD-10-CM | POA: Diagnosis not present

## 2016-04-21 DIAGNOSIS — R079 Chest pain, unspecified: Secondary | ICD-10-CM | POA: Diagnosis not present

## 2016-04-21 DIAGNOSIS — S2001XA Contusion of right breast, initial encounter: Secondary | ICD-10-CM

## 2016-04-21 MED ORDER — HYDROCODONE-ACETAMINOPHEN 5-325 MG PO TABS
2.0000 | ORAL_TABLET | Freq: Once | ORAL | Status: AC
  Administered 2016-04-21: 2 via ORAL
  Filled 2016-04-21: qty 2

## 2016-04-21 MED ORDER — HYDROCODONE-ACETAMINOPHEN 5-325 MG PO TABS: 2.0000 | ORAL_TABLET | ORAL | 0 refills | Status: DC | PRN

## 2016-04-21 MED ORDER — IBUPROFEN 800 MG PO TABS: 800.0000 mg | ORAL_TABLET | Freq: Three times a day (TID) | ORAL | 0 refills | Status: DC
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
## 2016-04-27 ENCOUNTER — Encounter (HOSPITAL_COMMUNITY): Payer: Self-pay | Admitting: Psychiatry

## 2016-04-27 ENCOUNTER — Ambulatory Visit (INDEPENDENT_AMBULATORY_CARE_PROVIDER_SITE_OTHER): Payer: BLUE CROSS/BLUE SHIELD | Admitting: Psychiatry

## 2016-04-27 VITALS — BP 130/70 | HR 73 | Ht 63.0 in | Wt 296.8 lb

## 2016-04-27 DIAGNOSIS — F331 Major depressive disorder, recurrent, moderate: Secondary | ICD-10-CM | POA: Diagnosis not present

## 2016-04-27 MED ORDER — ZOLPIDEM TARTRATE 10 MG PO TABS: 10.0000 mg | ORAL_TABLET | Freq: Every evening | ORAL | 3 refills | Status: DC | PRN

## 2016-04-27 MED ORDER — ALPRAZOLAM 1 MG PO TABS: 1.0000 mg | ORAL_TABLET | Freq: Three times a day (TID) | ORAL | 3 refills | Status: DC | PRN

## 2016-04-27 MED ORDER — ESCITALOPRAM OXALATE 20 MG PO TABS: ORAL_TABLET | ORAL | 3 refills | Status: DC
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
## 2016-07-02 DIAGNOSIS — L663 Perifolliculitis capitis abscedens: Secondary | ICD-10-CM | POA: Diagnosis not present

## 2016-07-02 DIAGNOSIS — Z8049 Family history of malignant neoplasm of other genital organs: Secondary | ICD-10-CM | POA: Diagnosis not present

## 2016-07-02 DIAGNOSIS — F411 Generalized anxiety disorder: Secondary | ICD-10-CM | POA: Diagnosis not present

## 2016-07-02 DIAGNOSIS — R739 Hyperglycemia, unspecified: Secondary | ICD-10-CM | POA: Diagnosis not present

## 2016-07-02 DIAGNOSIS — Z23 Encounter for immunization: Secondary | ICD-10-CM | POA: Diagnosis not present

## 2016-07-02 DIAGNOSIS — J45909 Unspecified asthma, uncomplicated: Secondary | ICD-10-CM | POA: Diagnosis not present

## 2016-07-02 DIAGNOSIS — F17200 Nicotine dependence, unspecified, uncomplicated: Secondary | ICD-10-CM | POA: Diagnosis not present

## 2016-07-02 DIAGNOSIS — E282 Polycystic ovarian syndrome: Secondary | ICD-10-CM | POA: Diagnosis not present

## 2016-07-02 DIAGNOSIS — N764 Abscess of vulva: Secondary | ICD-10-CM | POA: Diagnosis not present

## 2016-07-02 DIAGNOSIS — Z801 Family history of malignant neoplasm of trachea, bronchus and lung: Secondary | ICD-10-CM | POA: Diagnosis not present

## 2016-07-02 DIAGNOSIS — Z7984 Long term (current) use of oral hypoglycemic drugs: Secondary | ICD-10-CM | POA: Diagnosis not present

## 2016-07-02 DIAGNOSIS — Z6841 Body Mass Index (BMI) 40.0 and over, adult: Secondary | ICD-10-CM | POA: Diagnosis not present

## 2016-07-02 DIAGNOSIS — Z791 Long term (current) use of non-steroidal anti-inflammatories (NSAID): Secondary | ICD-10-CM | POA: Diagnosis not present

## 2016-07-02 DIAGNOSIS — F329 Major depressive disorder, single episode, unspecified: Secondary | ICD-10-CM | POA: Diagnosis not present

## 2016-07-02 DIAGNOSIS — Z79899 Other long term (current) drug therapy: Secondary | ICD-10-CM | POA: Diagnosis not present

## 2016-07-03 DIAGNOSIS — Z6841 Body Mass Index (BMI) 40.0 and over, adult: Secondary | ICD-10-CM | POA: Diagnosis not present

## 2016-07-03 DIAGNOSIS — E282 Polycystic ovarian syndrome: Secondary | ICD-10-CM | POA: Diagnosis not present

## 2016-07-03 DIAGNOSIS — N764 Abscess of vulva: Secondary | ICD-10-CM | POA: Diagnosis not present

## 2016-07-04 DIAGNOSIS — E282 Polycystic ovarian syndrome: Secondary | ICD-10-CM | POA: Diagnosis not present

## 2016-07-04 DIAGNOSIS — N764 Abscess of vulva: Secondary | ICD-10-CM | POA: Diagnosis not present

## 2016-07-04 DIAGNOSIS — Z6841 Body Mass Index (BMI) 40.0 and over, adult: Secondary | ICD-10-CM | POA: Diagnosis not present

## 2016-07-07 DIAGNOSIS — L02215 Cutaneous abscess of perineum: Secondary | ICD-10-CM | POA: Diagnosis not present

## 2016-07-07 DIAGNOSIS — Z6841 Body Mass Index (BMI) 40.0 and over, adult: Secondary | ICD-10-CM | POA: Diagnosis not present

## 2016-07-11 DIAGNOSIS — Z6841 Body Mass Index (BMI) 40.0 and over, adult: Secondary | ICD-10-CM | POA: Diagnosis not present

## 2016-07-11 DIAGNOSIS — L02215 Cutaneous abscess of perineum: Secondary | ICD-10-CM | POA: Diagnosis not present

## 2016-07-27 DIAGNOSIS — Z01419 Encounter for gynecological examination (general) (routine) without abnormal findings: Secondary | ICD-10-CM | POA: Diagnosis not present

## 2016-07-27 DIAGNOSIS — Z1151 Encounter for screening for human papillomavirus (HPV): Secondary | ICD-10-CM | POA: Diagnosis not present

## 2016-07-27 DIAGNOSIS — N6310 Unspecified lump in the right breast, unspecified quadrant: Secondary | ICD-10-CM | POA: Diagnosis not present

## 2016-08-09 DIAGNOSIS — R928 Other abnormal and inconclusive findings on diagnostic imaging of breast: Secondary | ICD-10-CM | POA: Diagnosis not present

## 2016-08-09 DIAGNOSIS — N6489 Other specified disorders of breast: Secondary | ICD-10-CM | POA: Diagnosis not present

## 2016-08-09 DIAGNOSIS — N6312 Unspecified lump in the right breast, upper inner quadrant: Secondary | ICD-10-CM | POA: Diagnosis not present

## 2016-08-09 DIAGNOSIS — N6081 Other benign mammary dysplasias of right breast: Secondary | ICD-10-CM | POA: Diagnosis not present

## 2016-08-10 ENCOUNTER — Ambulatory Visit (INDEPENDENT_AMBULATORY_CARE_PROVIDER_SITE_OTHER): Payer: BLUE CROSS/BLUE SHIELD | Admitting: Psychiatry

## 2016-08-10 ENCOUNTER — Encounter (HOSPITAL_COMMUNITY): Payer: Self-pay | Admitting: Psychiatry

## 2016-08-10 DIAGNOSIS — F17210 Nicotine dependence, cigarettes, uncomplicated: Secondary | ICD-10-CM | POA: Diagnosis not present

## 2016-08-10 DIAGNOSIS — F331 Major depressive disorder, recurrent, moderate: Secondary | ICD-10-CM | POA: Diagnosis not present

## 2016-08-10 DIAGNOSIS — Z818 Family history of other mental and behavioral disorders: Secondary | ICD-10-CM | POA: Diagnosis not present

## 2016-08-10 DIAGNOSIS — F419 Anxiety disorder, unspecified: Secondary | ICD-10-CM

## 2016-08-10 MED ORDER — ALPRAZOLAM 1 MG PO TABS: 1.0000 mg | ORAL_TABLET | Freq: Three times a day (TID) | ORAL | 3 refills | Status: DC | PRN

## 2016-08-10 MED ORDER — ZOLPIDEM TARTRATE 10 MG PO TABS: 10.0000 mg | ORAL_TABLET | Freq: Every evening | ORAL | 3 refills | Status: DC | PRN

## 2016-08-10 MED ORDER — ESCITALOPRAM OXALATE 20 MG PO TABS: ORAL_TABLET | ORAL | 3 refills | Status: DC
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
## 2016-08-23 DIAGNOSIS — N6312 Unspecified lump in the right breast, upper inner quadrant: Secondary | ICD-10-CM | POA: Diagnosis not present

## 2016-08-23 DIAGNOSIS — N641 Fat necrosis of breast: Secondary | ICD-10-CM | POA: Diagnosis not present

## 2016-09-11 DIAGNOSIS — Z713 Dietary counseling and surveillance: Secondary | ICD-10-CM | POA: Diagnosis not present

## 2016-09-11 DIAGNOSIS — Z6841 Body Mass Index (BMI) 40.0 and over, adult: Secondary | ICD-10-CM | POA: Diagnosis not present

## 2016-10-19 DIAGNOSIS — H6503 Acute serous otitis media, bilateral: Secondary | ICD-10-CM | POA: Diagnosis not present

## 2016-10-19 DIAGNOSIS — J301 Allergic rhinitis due to pollen: Secondary | ICD-10-CM | POA: Diagnosis not present

## 2016-10-30 DIAGNOSIS — Z6841 Body Mass Index (BMI) 40.0 and over, adult: Secondary | ICD-10-CM | POA: Diagnosis not present

## 2016-10-30 DIAGNOSIS — J040 Acute laryngitis: Secondary | ICD-10-CM | POA: Diagnosis not present

## 2016-11-14 ENCOUNTER — Ambulatory Visit (INDEPENDENT_AMBULATORY_CARE_PROVIDER_SITE_OTHER): Payer: BLUE CROSS/BLUE SHIELD | Admitting: Psychiatry

## 2016-11-14 ENCOUNTER — Encounter (HOSPITAL_COMMUNITY): Payer: Self-pay | Admitting: Psychiatry

## 2016-11-14 VITALS — BP 130/88 | HR 93 | Ht 63.0 in | Wt 294.0 lb

## 2016-11-14 DIAGNOSIS — F411 Generalized anxiety disorder: Secondary | ICD-10-CM

## 2016-11-14 DIAGNOSIS — G479 Sleep disorder, unspecified: Secondary | ICD-10-CM

## 2016-11-14 DIAGNOSIS — E669 Obesity, unspecified: Secondary | ICD-10-CM

## 2016-11-14 DIAGNOSIS — F17200 Nicotine dependence, unspecified, uncomplicated: Secondary | ICD-10-CM | POA: Diagnosis not present

## 2016-11-14 DIAGNOSIS — Z818 Family history of other mental and behavioral disorders: Secondary | ICD-10-CM

## 2016-11-14 DIAGNOSIS — Z6841 Body Mass Index (BMI) 40.0 and over, adult: Secondary | ICD-10-CM

## 2016-11-14 DIAGNOSIS — G8929 Other chronic pain: Secondary | ICD-10-CM | POA: Diagnosis not present

## 2016-11-14 DIAGNOSIS — F331 Major depressive disorder, recurrent, moderate: Secondary | ICD-10-CM

## 2016-11-14 DIAGNOSIS — E282 Polycystic ovarian syndrome: Secondary | ICD-10-CM

## 2016-11-14 DIAGNOSIS — Z79899 Other long term (current) drug therapy: Secondary | ICD-10-CM | POA: Diagnosis not present

## 2016-11-14 DIAGNOSIS — Z7984 Long term (current) use of oral hypoglycemic drugs: Secondary | ICD-10-CM | POA: Diagnosis not present

## 2016-11-14 DIAGNOSIS — F329 Major depressive disorder, single episode, unspecified: Secondary | ICD-10-CM

## 2016-11-14 MED ORDER — ESCITALOPRAM OXALATE 20 MG PO TABS: ORAL_TABLET | ORAL | 3 refills | Status: DC

## 2016-11-14 MED ORDER — ALPRAZOLAM 1 MG PO TABS: 1.0000 mg | ORAL_TABLET | Freq: Three times a day (TID) | ORAL | 3 refills | Status: DC | PRN

## 2016-11-14 MED ORDER — ZOLPIDEM TARTRATE 10 MG PO TABS: 10.0000 mg | ORAL_TABLET | Freq: Every evening | ORAL | 3 refills | Status: DC | PRN
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
## 2017-01-19 DIAGNOSIS — R079 Chest pain, unspecified: Secondary | ICD-10-CM | POA: Diagnosis not present

## 2017-03-26 ENCOUNTER — Other Ambulatory Visit (HOSPITAL_COMMUNITY): Payer: Self-pay | Admitting: Psychiatry

## 2017-03-26 DIAGNOSIS — F331 Major depressive disorder, recurrent, moderate: Secondary | ICD-10-CM

## 2017-03-30 ENCOUNTER — Encounter (HOSPITAL_COMMUNITY): Payer: Self-pay | Admitting: Psychiatry

## 2017-03-30 ENCOUNTER — Ambulatory Visit (INDEPENDENT_AMBULATORY_CARE_PROVIDER_SITE_OTHER): Payer: BLUE CROSS/BLUE SHIELD | Admitting: Psychiatry

## 2017-03-30 DIAGNOSIS — F419 Anxiety disorder, unspecified: Secondary | ICD-10-CM

## 2017-03-30 DIAGNOSIS — E282 Polycystic ovarian syndrome: Secondary | ICD-10-CM | POA: Diagnosis not present

## 2017-03-30 DIAGNOSIS — Z818 Family history of other mental and behavioral disorders: Secondary | ICD-10-CM | POA: Diagnosis not present

## 2017-03-30 DIAGNOSIS — F331 Major depressive disorder, recurrent, moderate: Secondary | ICD-10-CM

## 2017-03-30 DIAGNOSIS — F17210 Nicotine dependence, cigarettes, uncomplicated: Secondary | ICD-10-CM | POA: Diagnosis not present

## 2017-03-30 MED ORDER — ESCITALOPRAM OXALATE 20 MG PO TABS: ORAL_TABLET | ORAL | 3 refills | Status: DC

## 2017-03-30 MED ORDER — ALPRAZOLAM 1 MG PO TABS: 1.0000 mg | ORAL_TABLET | Freq: Three times a day (TID) | ORAL | 3 refills | Status: DC | PRN

## 2017-03-30 MED ORDER — ZOLPIDEM TARTRATE 10 MG PO TABS: 10.0000 mg | ORAL_TABLET | Freq: Every evening | ORAL | 3 refills | Status: DC | PRN
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
## 2017-04-11 ENCOUNTER — Emergency Department (HOSPITAL_COMMUNITY): Payer: Worker's Compensation

## 2017-04-11 ENCOUNTER — Encounter (HOSPITAL_COMMUNITY): Payer: Self-pay

## 2017-04-11 ENCOUNTER — Emergency Department (HOSPITAL_COMMUNITY)
Admission: EM | Admit: 2017-04-11 | Discharge: 2017-04-11 | Disposition: A | Discharge status: Home / Self Care | Payer: Worker's Compensation | Attending: Emergency Medicine | Admitting: Emergency Medicine

## 2017-04-11 DIAGNOSIS — R55 Syncope and collapse: Secondary | ICD-10-CM | POA: Diagnosis not present

## 2017-04-11 DIAGNOSIS — Z7984 Long term (current) use of oral hypoglycemic drugs: Secondary | ICD-10-CM | POA: Diagnosis not present

## 2017-04-11 DIAGNOSIS — J45909 Unspecified asthma, uncomplicated: Secondary | ICD-10-CM | POA: Insufficient documentation

## 2017-04-11 DIAGNOSIS — R079 Chest pain, unspecified: Secondary | ICD-10-CM | POA: Diagnosis not present

## 2017-04-11 DIAGNOSIS — Z79899 Other long term (current) drug therapy: Secondary | ICD-10-CM | POA: Insufficient documentation

## 2017-04-11 DIAGNOSIS — R0789 Other chest pain: Secondary | ICD-10-CM

## 2017-04-11 DIAGNOSIS — F17210 Nicotine dependence, cigarettes, uncomplicated: Secondary | ICD-10-CM | POA: Diagnosis not present

## 2017-04-11 DIAGNOSIS — S2190XA Unspecified open wound of unspecified part of thorax, initial encounter: Secondary | ICD-10-CM | POA: Diagnosis not present

## 2017-04-11 DIAGNOSIS — S299XXA Unspecified injury of thorax, initial encounter: Secondary | ICD-10-CM | POA: Diagnosis not present

## 2017-04-11 LAB — URINALYSIS, ROUTINE W REFLEX MICROSCOPIC
BILIRUBIN URINE: NEGATIVE
GLUCOSE, UA: NEGATIVE mg/dL
Hgb urine dipstick: NEGATIVE
KETONES UR: NEGATIVE mg/dL
Leukocytes, UA: NEGATIVE
Nitrite: NEGATIVE
PH: 5 (ref 5.0–8.0)
Protein, ur: NEGATIVE mg/dL
Specific Gravity, Urine: 1.023 (ref 1.005–1.030)

## 2017-04-11 LAB — I-STAT CHEM 8, ED
BUN: 11 mg/dL (ref 6–20)
CALCIUM ION: 1.12 mmol/L — AB (ref 1.15–1.40)
CHLORIDE: 104 mmol/L (ref 101–111)
Creatinine, Ser: 0.5 mg/dL (ref 0.44–1.00)
GLUCOSE: 102 mg/dL — AB (ref 65–99)
HCT: 46 % (ref 36.0–46.0)
Hemoglobin: 15.6 g/dL — ABNORMAL HIGH (ref 12.0–15.0)
Potassium: 3.7 mmol/L (ref 3.5–5.1)
SODIUM: 141 mmol/L (ref 135–145)
TCO2: 24 mmol/L (ref 22–32)

## 2017-04-11 LAB — RAPID URINE DRUG SCREEN, HOSP PERFORMED
AMPHETAMINES: POSITIVE — AB
BENZODIAZEPINES: POSITIVE — AB
Barbiturates: NOT DETECTED
Cocaine: NOT DETECTED
Opiates: NOT DETECTED
Tetrahydrocannabinol: NOT DETECTED

## 2017-04-11 LAB — I-STAT BETA HCG BLOOD, ED (MC, WL, AP ONLY): I-stat hCG, quantitative: <5 m[IU]/mL (ref <5)

## 2017-04-11 MED ORDER — IBUPROFEN 600 MG PO TABS: 600.0000 mg | ORAL_TABLET | Freq: Four times a day (QID) | ORAL | 0 refills | Status: AC | PRN
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
## 2017-04-11 NOTE — ED Notes (Signed)
Pt in radiology at this time. 
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
## 2017-07-18 ENCOUNTER — Encounter (HOSPITAL_COMMUNITY): Payer: Self-pay | Admitting: Psychiatry

## 2017-07-18 ENCOUNTER — Ambulatory Visit (INDEPENDENT_AMBULATORY_CARE_PROVIDER_SITE_OTHER): Payer: BLUE CROSS/BLUE SHIELD | Admitting: Psychiatry

## 2017-07-18 DIAGNOSIS — Z818 Family history of other mental and behavioral disorders: Secondary | ICD-10-CM

## 2017-07-18 DIAGNOSIS — F331 Major depressive disorder, recurrent, moderate: Secondary | ICD-10-CM | POA: Diagnosis not present

## 2017-07-18 DIAGNOSIS — E282 Polycystic ovarian syndrome: Secondary | ICD-10-CM | POA: Diagnosis not present

## 2017-07-18 DIAGNOSIS — F17210 Nicotine dependence, cigarettes, uncomplicated: Secondary | ICD-10-CM | POA: Diagnosis not present

## 2017-07-18 MED ORDER — ESCITALOPRAM OXALATE 20 MG PO TABS: ORAL_TABLET | ORAL | 3 refills | Status: DC

## 2017-07-18 MED ORDER — ALPRAZOLAM 1 MG PO TABS
1.0000 mg | ORAL_TABLET | Freq: Three times a day (TID) | ORAL | 3 refills | Status: DC | PRN
Start: 2017-07-18 — End: 2017-10-19

## 2017-07-18 MED ORDER — ZOLPIDEM TARTRATE 10 MG PO TABS: 10.0000 mg | ORAL_TABLET | Freq: Every evening | ORAL | 3 refills | Status: DC | PRN

## 2017-07-18 NOTE — Progress Notes (Signed)
BH MD/PA/NP OP Progress Note  07/18/2017 8:55 AM Natalie Walker  MRN:  [id number]  Chief Complaint:  Chief Complaint    Depression; Anxiety; Follow-up     HPI: This patient is a 47 year old divorced white female who lives with her boyfriend and a daughter age 68 and a son age 74 in [place]. 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 She has polycystic ovary disease and had a difficult time getting pregnant. She went through several miscarriages. For the past 10 years or so she been on Prozac which helped but eventually it burned out. She was started on Lexapro 20 mg per day last March. She was doing well until about 3 or four weeks ago. 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 She gets upset and depressed when the patients die. Her own father died of cancer several years ago and this seems to be stirring up bad memories.  The patient returns after 4 months.  She states that a lot has happened since I saw her.  Her mother died of endometrial cancer that had metastasized on February 6.  Prior to that she had been going through chemotherapy and it just was not working anymore.  The patient and her brothers took care of her mom for the last few weeks of her life and she feels gratified she was able to be there for her.  She states that at times she is cried but for the most part she has just had to keep going and working and taking care of her children.  Her mood is stable and she is sleeping well most of the time.  Xanax continues to help with her anxiety.  atVisit Diagnosis:    ICD-10-CM   1. Major depressive disorder, recurrent episode, moderate (HCC) F33.1 ALPRAZolam (XANAX) 1 MG tablet    Past Psychiatric History: Prior outpatient treatment  Past Medical History:  Past Medical History:  Diagnosis Date  . Anxiety   . Asthma   . Chest pain    NO CAD by cath  12/12/11, nl LV fxn  . Depression   . Headache(784.0)   . Hypertriglyceridemia   . Obesity   . Polycystic ovarian disease   . Tobacco abuse     Past Surgical History:  Procedure Laterality Date  . CARDIAC CATHETERIZATION    . CESAREAN SECTION    . CHOLECYSTECTOMY    . DILATION AND CURETTAGE OF UTERUS    . ECTOPIC PREGNANCY SURGERY    . LEFT HEART CATHETERIZATION WITH CORONARY ANGIOGRAM N/A 12/12/2011   Procedure: LEFT HEART CATHETERIZATION WITH CORONARY ANGIOGRAM;  Surgeon: Tonny Bollman, MD;  Location: [facility] CATH LAB;  Service: Cardiovascular;  Laterality: N/A;  . TUBAL LIGATION      Family Psychiatric History: See below  Family History:  Family History  Problem Relation Age of Onset  . Depression Paternal Aunt   . Depression Paternal Uncle     Social History:  Social History   Socioeconomic History  . Marital status: Legally Separated    Spouse name: None  . Number of children: None  . Years of education: None  . Highest education level: None  Social Needs  . Financial resource strain: None  . Food insecurity - worry: None  . Food insecurity - inability: None  . Transportation needs - medical: None  . Transportation needs -  non-medical: None  Occupational History  . None  Tobacco Use  . Smoking status: Current Every Day Smoker    Packs/day: 0.50    Types: Cigarettes  . Smokeless tobacco: Current User  . Tobacco comment: patches  Substance and Sexual Activity  . Alcohol use: No  . Drug use: No  . Sexual activity: Yes    Partners: Male    Birth control/protection: Surgical  Other Topics Concern  . None  Social History Narrative  . None    Allergies: No Known Allergies  Metabolic Disorder Labs: No results found for: HGBA1C, MPG No results found for: PROLACTIN Lab Results  Component Value Date   CHOL 149 04/08/2012   TRIG 138 04/08/2012   HDL 36 (L) 04/08/2012   CHOLHDL 4.1 04/08/2012   VLDL 28 04/08/2012   LDLCALC 85 04/08/2012   LDLCALC 83  12/13/2011   Lab Results  Component Value Date   TSH 1.716 04/08/2012   TSH 1.518 12/12/2011    Therapeutic Level Labs: No results found for: LITHIUM No results found for: VALPROATE No components found for:  CBMZ  Current Medications: Current Outpatient Medications  Medication Sig Dispense Refill  . ALPRAZolam (XANAX) 1 MG tablet Take 1 tablet (1 mg total) by mouth 3 (three) times daily as needed for anxiety. For anxiety 90 tablet 3  . aspirin-acetaminophen-caffeine (EXCEDRIN MIGRAINE) 250-250-65 MG per tablet Take 1 tablet by mouth every 6 (six) hours as needed for pain.    Marland Kitchen escitalopram (LEXAPRO) 20 MG tablet Taking 2 in AM and 1 in PM 90 tablet 3  . ibuprofen (ADVIL,MOTRIN) 600 MG tablet Take 1 tablet (600 mg total) by mouth every 6 (six) hours as needed. 30 tablet 0  . loratadine (CLARITIN) 10 MG tablet Take 1 tablet (10 mg total) by mouth daily. (Patient taking differently: Take 10 mg by mouth as needed. ) 14 tablet 0  . metFORMIN (GLUCOPHAGE) 1000 MG tablet Take 2,000 mg by mouth at bedtime.    Marland Kitchen zolpidem (AMBIEN) 10 MG tablet Take 1 tablet (10 mg total) by mouth at bedtime as needed for sleep. 30 tablet 3  . phentermine (ADIPEX-P) 37.5 MG tablet Take 37.5 mg by mouth daily before breakfast.     No current facility-administered medications for this visit.      Musculoskeletal: Strength & Muscle Tone: within normal limits Gait & Station: normal Patient leans: N/A  Psychiatric Specialty Exam: Review of Systems  All other systems reviewed and are negative.   Blood pressure 130/80, pulse 83, height 5\' 3"  (1.6 m), weight 277 lb (125.6 kg).Body mass index is 49.07 kg/m.  General Appearance: Casual, Neat and Well Groomed  Eye Contact:  Good  Speech:  Clear and Coherent  Volume:  Normal  Mood:  Dysphoric  Affect:  Congruent  Thought Process:  Goal Directed  Orientation:  Full (Time, Place, and Person)  Thought Content: WDL   Suicidal Thoughts:  No  Homicidal Thoughts:   No  Memory:  Immediate;   Good Recent;   Good Remote;   Good  Judgement:  Good  Insight:  Good  Psychomotor Activity:  Normal  Concentration:  Concentration: Good and Attention Span: Good  Recall:  Good  Fund of Knowledge: Good  Language: Good  Akathisia:  No  Handed:  Right  AIMS (if indicated): not done  Assets:  Communication Skills Desire for Improvement Physical Health Resilience Social Support Talents/Skills  ADL's:  Intact  Cognition: WNL  Sleep:  Fair   Screenings:  Assessment and Plan: This patient is a 47 year old female with a history of depression and anxiety.  She is gone through a lot of stress recently with her mother's decline and death.  However she is holding up well so far.  She will continue Lexapro 60 mg daily for depression, Xanax 1 mg 3 times daily as needed for anxiety and Ambien 10 mg at bedtime for sleep.  She will return to see me in 3 months   Diannia Rudereborah Ross, MD 07/18/2017, 8:55 AM

## 2017-08-14 DIAGNOSIS — Z6841 Body Mass Index (BMI) 40.0 and over, adult: Secondary | ICD-10-CM | POA: Diagnosis not present

## 2017-08-14 DIAGNOSIS — M545 Low back pain: Secondary | ICD-10-CM | POA: Diagnosis not present

## 2017-08-14 DIAGNOSIS — J301 Allergic rhinitis due to pollen: Secondary | ICD-10-CM | POA: Diagnosis not present

## 2017-10-16 DIAGNOSIS — I878 Other specified disorders of veins: Secondary | ICD-10-CM | POA: Diagnosis not present

## 2017-10-16 DIAGNOSIS — I10 Essential (primary) hypertension: Secondary | ICD-10-CM | POA: Diagnosis not present

## 2017-10-16 DIAGNOSIS — Z6841 Body Mass Index (BMI) 40.0 and over, adult: Secondary | ICD-10-CM | POA: Diagnosis not present

## 2017-10-18 ENCOUNTER — Ambulatory Visit (HOSPITAL_COMMUNITY): Payer: Self-pay | Admitting: Psychiatry

## 2017-10-19 ENCOUNTER — Encounter (HOSPITAL_COMMUNITY): Payer: Self-pay | Admitting: Psychiatry

## 2017-10-19 ENCOUNTER — Ambulatory Visit (INDEPENDENT_AMBULATORY_CARE_PROVIDER_SITE_OTHER): Payer: BLUE CROSS/BLUE SHIELD | Admitting: Psychiatry

## 2017-10-19 DIAGNOSIS — R450 Nervousness: Secondary | ICD-10-CM

## 2017-10-19 DIAGNOSIS — Z818 Family history of other mental and behavioral disorders: Secondary | ICD-10-CM | POA: Diagnosis not present

## 2017-10-19 DIAGNOSIS — F331 Major depressive disorder, recurrent, moderate: Secondary | ICD-10-CM | POA: Diagnosis not present

## 2017-10-19 DIAGNOSIS — F17210 Nicotine dependence, cigarettes, uncomplicated: Secondary | ICD-10-CM

## 2017-10-19 DIAGNOSIS — Z638 Other specified problems related to primary support group: Secondary | ICD-10-CM

## 2017-10-19 DIAGNOSIS — F419 Anxiety disorder, unspecified: Secondary | ICD-10-CM

## 2017-10-19 DIAGNOSIS — E282 Polycystic ovarian syndrome: Secondary | ICD-10-CM

## 2017-10-19 MED ORDER — ESCITALOPRAM OXALATE 20 MG PO TABS: ORAL_TABLET | ORAL | 3 refills | Status: DC

## 2017-10-19 MED ORDER — ALPRAZOLAM 1 MG PO TABS: 1.0000 mg | ORAL_TABLET | Freq: Three times a day (TID) | ORAL | 3 refills | Status: DC | PRN

## 2017-10-19 MED ORDER — ZOLPIDEM TARTRATE 10 MG PO TABS: 10.0000 mg | ORAL_TABLET | Freq: Every evening | ORAL | 3 refills | Status: DC | PRN
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
## 2017-10-23 ENCOUNTER — Encounter (HOSPITAL_COMMUNITY): Payer: Self-pay | Admitting: Psychiatry

## 2017-10-23 ENCOUNTER — Ambulatory Visit (HOSPITAL_COMMUNITY): Payer: BLUE CROSS/BLUE SHIELD | Admitting: Psychiatry

## 2017-10-23 ENCOUNTER — Ambulatory Visit (INDEPENDENT_AMBULATORY_CARE_PROVIDER_SITE_OTHER): Payer: BLUE CROSS/BLUE SHIELD | Admitting: Psychiatry

## 2017-10-23 DIAGNOSIS — F331 Major depressive disorder, recurrent, moderate: Secondary | ICD-10-CM
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
## 2017-11-07 DIAGNOSIS — N939 Abnormal uterine and vaginal bleeding, unspecified: Secondary | ICD-10-CM | POA: Diagnosis not present

## 2017-11-08 ENCOUNTER — Ambulatory Visit (HOSPITAL_COMMUNITY): Payer: Self-pay | Admitting: Psychiatry

## 2017-11-20 DIAGNOSIS — N939 Abnormal uterine and vaginal bleeding, unspecified: Secondary | ICD-10-CM | POA: Diagnosis not present

## 2017-12-11 ENCOUNTER — Ambulatory Visit (HOSPITAL_COMMUNITY): Payer: BLUE CROSS/BLUE SHIELD | Admitting: Psychiatry

## 2017-12-14 DIAGNOSIS — I10 Essential (primary) hypertension: Secondary | ICD-10-CM | POA: Diagnosis not present

## 2017-12-14 DIAGNOSIS — Z6841 Body Mass Index (BMI) 40.0 and over, adult: Secondary | ICD-10-CM | POA: Diagnosis not present

## 2017-12-14 DIAGNOSIS — F329 Major depressive disorder, single episode, unspecified: Secondary | ICD-10-CM | POA: Diagnosis not present

## 2017-12-14 DIAGNOSIS — Z7984 Long term (current) use of oral hypoglycemic drugs: Secondary | ICD-10-CM | POA: Diagnosis not present

## 2017-12-14 DIAGNOSIS — Z79899 Other long term (current) drug therapy: Secondary | ICD-10-CM | POA: Diagnosis not present

## 2017-12-14 DIAGNOSIS — Z30430 Encounter for insertion of intrauterine contraceptive device: Secondary | ICD-10-CM | POA: Diagnosis not present

## 2017-12-14 DIAGNOSIS — F419 Anxiety disorder, unspecified: Secondary | ICD-10-CM | POA: Diagnosis not present

## 2017-12-14 DIAGNOSIS — E669 Obesity, unspecified: Secondary | ICD-10-CM | POA: Diagnosis not present

## 2017-12-14 DIAGNOSIS — E282 Polycystic ovarian syndrome: Secondary | ICD-10-CM | POA: Diagnosis not present

## 2017-12-14 DIAGNOSIS — Z9049 Acquired absence of other specified parts of digestive tract: Secondary | ICD-10-CM | POA: Diagnosis not present

## 2017-12-14 DIAGNOSIS — F17200 Nicotine dependence, unspecified, uncomplicated: Secondary | ICD-10-CM | POA: Diagnosis not present

## 2017-12-14 DIAGNOSIS — N920 Excessive and frequent menstruation with regular cycle: Secondary | ICD-10-CM | POA: Diagnosis not present

## 2017-12-14 DIAGNOSIS — N882 Stricture and stenosis of cervix uteri: Secondary | ICD-10-CM | POA: Diagnosis not present

## 2017-12-14 DIAGNOSIS — K219 Gastro-esophageal reflux disease without esophagitis: Secondary | ICD-10-CM | POA: Diagnosis not present

## 2017-12-14 DIAGNOSIS — Z9851 Tubal ligation status: Secondary | ICD-10-CM | POA: Diagnosis not present

## 2017-12-17 DIAGNOSIS — Z7984 Long term (current) use of oral hypoglycemic drugs: Secondary | ICD-10-CM | POA: Diagnosis not present

## 2017-12-17 DIAGNOSIS — N920 Excessive and frequent menstruation with regular cycle: Secondary | ICD-10-CM | POA: Diagnosis not present

## 2017-12-17 DIAGNOSIS — F419 Anxiety disorder, unspecified: Secondary | ICD-10-CM | POA: Diagnosis not present

## 2017-12-17 DIAGNOSIS — F329 Major depressive disorder, single episode, unspecified: Secondary | ICD-10-CM | POA: Diagnosis not present

## 2017-12-17 DIAGNOSIS — Z79899 Other long term (current) drug therapy: Secondary | ICD-10-CM | POA: Diagnosis not present

## 2017-12-17 DIAGNOSIS — Z9049 Acquired absence of other specified parts of digestive tract: Secondary | ICD-10-CM | POA: Diagnosis not present

## 2017-12-17 DIAGNOSIS — Z9851 Tubal ligation status: Secondary | ICD-10-CM | POA: Diagnosis not present

## 2017-12-17 DIAGNOSIS — E669 Obesity, unspecified: Secondary | ICD-10-CM | POA: Diagnosis not present

## 2017-12-17 DIAGNOSIS — Z6841 Body Mass Index (BMI) 40.0 and over, adult: Secondary | ICD-10-CM | POA: Diagnosis not present

## 2017-12-17 DIAGNOSIS — F17200 Nicotine dependence, unspecified, uncomplicated: Secondary | ICD-10-CM | POA: Diagnosis not present

## 2017-12-17 DIAGNOSIS — E282 Polycystic ovarian syndrome: Secondary | ICD-10-CM | POA: Diagnosis not present

## 2017-12-17 DIAGNOSIS — L905 Scar conditions and fibrosis of skin: Secondary | ICD-10-CM | POA: Diagnosis not present

## 2017-12-17 DIAGNOSIS — Z30430 Encounter for insertion of intrauterine contraceptive device: Secondary | ICD-10-CM | POA: Diagnosis not present

## 2017-12-17 DIAGNOSIS — N882 Stricture and stenosis of cervix uteri: Secondary | ICD-10-CM | POA: Diagnosis not present

## 2017-12-17 DIAGNOSIS — K219 Gastro-esophageal reflux disease without esophagitis: Secondary | ICD-10-CM | POA: Diagnosis not present

## 2017-12-17 DIAGNOSIS — I10 Essential (primary) hypertension: Secondary | ICD-10-CM | POA: Diagnosis not present

## 2018-01-17 ENCOUNTER — Ambulatory Visit (HOSPITAL_COMMUNITY): Payer: Self-pay | Admitting: Psychiatry

## 2018-02-21 ENCOUNTER — Encounter (HOSPITAL_COMMUNITY): Payer: Self-pay | Admitting: Psychiatry

## 2018-02-21 ENCOUNTER — Ambulatory Visit (INDEPENDENT_AMBULATORY_CARE_PROVIDER_SITE_OTHER): Payer: BLUE CROSS/BLUE SHIELD | Admitting: Psychiatry

## 2018-02-21 DIAGNOSIS — F331 Major depressive disorder, recurrent, moderate: Secondary | ICD-10-CM | POA: Diagnosis not present

## 2018-02-21 MED ORDER — ESCITALOPRAM OXALATE 20 MG PO TABS: ORAL_TABLET | ORAL | 3 refills | Status: DC

## 2018-02-21 MED ORDER — ZOLPIDEM TARTRATE 10 MG PO TABS: 10.0000 mg | ORAL_TABLET | Freq: Every evening | ORAL | 3 refills | Status: DC | PRN

## 2018-02-21 MED ORDER — ALPRAZOLAM 1 MG PO TABS: 1.0000 mg | ORAL_TABLET | Freq: Three times a day (TID) | ORAL | 3 refills | Status: DC | PRN

## 2018-02-21 NOTE — Progress Notes (Signed)
BH MD/PA/NP OP Progress Note  02/21/2018 8:49 AM Natalie Walker  MRN:  [id number]  Chief Complaint:  Chief Complaint    Depression; Anxiety; Follow-up     HPI: This patient is a 47 year old divorced white female who lives withher boyfriend and 2 childrenin [place]. 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 She has polycystic ovary disease and had a difficult time getting pregnant. She went through several miscarriages. For the past 10 years or so she been on Prozac which helped but eventually it burned out. She was started on Lexapro 20 mg per day last March. She was doing well until about 3 or four weeks ago. 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 She gets upset and depressed when the patients die. Her own father died of cancer several years ago and this seems to be stirring up bad memories.  The patient returns after 4 months.  Overall she is doing quite well.  Her son is in a new school and is no longer getting into trouble.  She was having abnormal uterine bleeding but had an IUD put in which has stopped this.  Her health has been good.  She denies depression or anxiety.  She and her boyfriend are finally getting married next month.  She is sleeping well. Visit Diagnosis:    ICD-10-CM   1. Major depressive disorder, recurrent episode, moderate (HCC) F33.1 ALPRAZolam (XANAX) 1 MG tablet    Past Psychiatric History: Prior outpatient treatment  Past Medical History:  Past Medical History:  Diagnosis Date  . Anxiety   . Asthma   . Chest pain    NO CAD by cath 12/12/11, nl LV fxn  . Depression   . Headache(784.0)   . Hypertriglyceridemia   . Obesity   . Polycystic ovarian disease   . Tobacco abuse     Past Surgical History:  Procedure Laterality Date  . CARDIAC CATHETERIZATION    . CESAREAN SECTION    . CHOLECYSTECTOMY    . DILATION AND  CURETTAGE OF UTERUS    . ECTOPIC PREGNANCY SURGERY    . LEFT HEART CATHETERIZATION WITH CORONARY ANGIOGRAM N/A 12/12/2011   Procedure: LEFT HEART CATHETERIZATION WITH CORONARY ANGIOGRAM;  Surgeon: Tonny Bollman, MD;  Location: [facility] CATH LAB;  Service: Cardiovascular;  Laterality: N/A;  . TUBAL LIGATION      Family Psychiatric History: See below  Family History:  Family History  Problem Relation Age of Onset  . Depression Son   . ADD / ADHD Son   . Depression Paternal Aunt   . Depression Paternal Uncle     Social History:  Social History   Socioeconomic History  . Marital status: Legally Separated    Spouse name: Not on file  . Number of children: Not on file  . Years of education: Not on file  . Highest education level: Not on file  Occupational History  . Not on file  Social Needs  . Financial resource strain: Not on file  . Food insecurity:    Worry: Not on file    Inability: Not on file  . Transportation needs:    Medical: Not on file    Non-medical: Not on file  Tobacco Use  . Smoking status: Current Every Day Smoker    Packs/day: 0.50    Types: Cigarettes  . Smokeless tobacco: Never  Used  Substance and Sexual Activity  . Alcohol use: Yes    Alcohol/week: 1.0 standard drinks    Types: 1 Glasses of wine per week    Comment: ocassional use with dinner 1x a week  . Drug use: No  . Sexual activity: Yes    Partners: Male    Birth control/protection: Surgical  Lifestyle  . Physical activity:    Days per week: Not on file    Minutes per session: Not on file  . Stress: Not on file  Relationships  . Social connections:    Talks on phone: Not on file    Gets together: Not on file    Attends religious service: Not on file    Active member of club or organization: Not on file    Attends meetings of clubs or organizations: Not on file    Relationship status: Not on file  Other Topics Concern  . Not on file  Social History Narrative  . Not on file     Allergies: No Known Allergies  Metabolic Disorder Labs: No results found for: HGBA1C, MPG No results found for: PROLACTIN Lab Results  Component Value Date   CHOL 149 04/08/2012   TRIG 138 04/08/2012   HDL 36 (L) 04/08/2012   CHOLHDL 4.1 04/08/2012   VLDL 28 04/08/2012   LDLCALC 85 04/08/2012   LDLCALC 83 12/13/2011   Lab Results  Component Value Date   TSH 1.716 04/08/2012   TSH 1.518 12/12/2011    Therapeutic Level Labs: No results found for: LITHIUM No results found for: VALPROATE No components found for:  CBMZ  Current Medications: Current Outpatient Medications  Medication Sig Dispense Refill  . ALPRAZolam (XANAX) 1 MG tablet Take 1 tablet (1 mg total) by mouth 3 (three) times daily as needed for anxiety. For anxiety 90 tablet 3  . aspirin-acetaminophen-caffeine (EXCEDRIN MIGRAINE) 250-250-65 MG per tablet Take 1 tablet by mouth every 6 (six) hours as needed for pain.    . benazepril-hydrochlorthiazide (LOTENSIN HCT) 20-25 MG tablet Take 1 tablet by mouth daily.    Marland Kitchen escitalopram (LEXAPRO) 20 MG tablet Taking 2 in AM and 1 in PM 90 tablet 3  . ibuprofen (ADVIL,MOTRIN) 600 MG tablet Take 1 tablet (600 mg total) by mouth every 6 (six) hours as needed. 30 tablet 0  . loratadine (CLARITIN) 10 MG tablet Take 1 tablet (10 mg total) by mouth daily. 14 tablet 0  . metFORMIN (GLUCOPHAGE) 1000 MG tablet Take 2,000 mg by mouth at bedtime.    Marland Kitchen zolpidem (AMBIEN) 10 MG tablet Take 1 tablet (10 mg total) by mouth at bedtime as needed for sleep. 30 tablet 3   No current facility-administered medications for this visit.      Musculoskeletal: Strength & Muscle Tone: within normal limits Gait & Station: normal Patient leans: N/A  Psychiatric Specialty Exam: Review of Systems  Musculoskeletal: Positive for back pain.  All other systems reviewed and are negative.   Blood pressure (!) 150/64, pulse 68, height 5\' 3"  (1.6 m), weight 269 lb (122 kg), SpO2 97 %.Body mass index  is 47.65 kg/m.  General Appearance: Casual, Neat and Well Groomed  Eye Contact:  Good  Speech:  Clear and Coherent  Volume:  Normal  Mood:  Euthymic  Affect:  Congruent  Thought Process:  Goal Directed  Orientation:  Full (Time, Place, and Person)  Thought Content: WDL   Suicidal Thoughts:  No  Homicidal Thoughts:  No  Memory:  Immediate;  Good Recent;   Good Remote;   Good  Judgement:  Good  Insight:  Fair  Psychomotor Activity:  Normal  Concentration:  Concentration: Good and Attention Span: Good  Recall:  Good  Fund of Knowledge: Good  Language: Good  Akathisia:  No  Handed:  Right  AIMS (if indicated): not done  Assets:  Communication Skills Desire for Improvement Physical Health Resilience Social Support Talents/Skills  ADL's:  Intact  Cognition: WNL  Sleep:  Good   Screenings:   Assessment and Plan: This patient is a 47 year old female with a history of depression and anxiety.  She is doing quite well on her current regimen.  She will continue Lexapro 20 mg 3 times daily for depression, Xanax 1 mg 3 times daily for anxiety and Ambien 10 mg at bedtime for sleep.  She will return to see me in 4 months   Diannia Ruder, MD 02/21/2018, 8:49 AM

## 2018-06-24 ENCOUNTER — Ambulatory Visit (HOSPITAL_COMMUNITY): Payer: BLUE CROSS/BLUE SHIELD | Admitting: Psychiatry

## 2018-06-26 ENCOUNTER — Ambulatory Visit (INDEPENDENT_AMBULATORY_CARE_PROVIDER_SITE_OTHER): Payer: BLUE CROSS/BLUE SHIELD | Admitting: Psychiatry

## 2018-06-26 ENCOUNTER — Telehealth (HOSPITAL_COMMUNITY): Payer: Self-pay | Admitting: Psychiatry

## 2018-06-26 ENCOUNTER — Encounter (HOSPITAL_COMMUNITY): Payer: Self-pay | Admitting: Psychiatry

## 2018-06-26 DIAGNOSIS — F331 Major depressive disorder, recurrent, moderate: Secondary | ICD-10-CM | POA: Diagnosis not present

## 2018-06-26 MED ORDER — ALPRAZOLAM 1 MG PO TABS: 1.0000 mg | ORAL_TABLET | Freq: Three times a day (TID) | ORAL | 3 refills | Status: DC | PRN

## 2018-06-26 MED ORDER — VORTIOXETINE HBR 20 MG PO TABS: 20.0000 mg | ORAL_TABLET | Freq: Every day | ORAL | 2 refills | Status: DC

## 2018-06-26 NOTE — Progress Notes (Signed)
BH MD/PA/NP OP Progress Note  06/26/2018 9:44 AM Natalie Walker  MRN:  [id number]  Chief Complaint:  Chief Complaint    Anxiety; Depression; Follow-up     HPI: This patient is a 48 year old divorced white female who lives withher boyfriend and 2 childrenin [place]. 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 She has polycystic ovary disease and had a difficult time getting pregnant. She went through several miscarriages. For the past 10 years or so she been on Prozac which helped but eventually it burned out. She was started on Lexapro 20 mg per day last March. She was doing well until about 3 or four weeks ago. 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 She gets upset and depressed when the patients die. Her own father died of cancer several years ago and this seems to be stirring up bad memories.  The patient returns after 4 months.  She states that she is not doing well.  Her mother died a year ago this month and she states she had never had time to grieve.  She had to go right back to work and she works with people who are sick and dying.  Over the last couple of months she has become increasingly depressed.  She has been crying all the time she has no energy and has a very hard time getting out of bed.  She stopped the Ambien because she was concerned about side effects and is still sleeping okay with melatonin.  However she is restless and gets up through the night to go the bathroom.  She is having dreams about her mother's illness.  She has thoughts about suicide but claims she would never act on them because of her children.  She has a supportive husband and children and her brothers are trying to reach out to her but she has been "pushing everybody away."  She has missed the last few days of work because she just felt too depressed to get up and go.  We decided  today that she does need much more intensive help.  She is not at the point of needing hospitalization but I suggested we get her into an intensive outpatient program that we have at [facility] in [place] and she is agreeable.  We will also change her antidepressant from Lexapro to Trintellix.  The Xanax is helping her anxiety and she is sleeping with the melatonin. Visit Diagnosis:    ICD-10-CM   1. Major depressive disorder, recurrent episode, moderate (HCC) F33.1 ALPRAZolam (XANAX) 1 MG tablet    Past Psychiatric History: Prior outpatient treatment  Past Medical History:  Past Medical History:  Diagnosis Date  . Anxiety   . Asthma   . Chest pain    NO CAD by cath 12/12/11, nl LV fxn  . Depression   . Headache(784.0)   . Hypertriglyceridemia   . Obesity   . Polycystic ovarian disease   . Tobacco abuse     Past Surgical History:  Procedure Laterality Date  . CARDIAC CATHETERIZATION    . CESAREAN SECTION    . CHOLECYSTECTOMY    . DILATION AND CURETTAGE OF UTERUS    . ECTOPIC PREGNANCY SURGERY    . LEFT HEART CATHETERIZATION WITH CORONARY ANGIOGRAM N/A 12/12/2011   Procedure: LEFT HEART CATHETERIZATION WITH CORONARY ANGIOGRAM;  Surgeon: Tonny Bollman, MD;  Location: [facility] CATH LAB;  Service: Cardiovascular;  Laterality: N/A;  . TUBAL LIGATION      Family Psychiatric History: See below  Family History:  Family History  Problem Relation Age of Onset  . Depression Son   . ADD / ADHD Son   . Depression Paternal Aunt   . Depression Paternal Uncle     Social History:  Social History   Socioeconomic History  . Marital status: Legally Separated    Spouse name: Not on file  . Number of children: Not on file  . Years of education: Not on file  . Highest education level: Not on file  Occupational History  . Not on file  Social Needs  . Financial resource strain: Not on file  . Food insecurity:    Worry: Not on file    Inability: Not on file  . Transportation needs:     Medical: Not on file    Non-medical: Not on file  Tobacco Use  . Smoking status: Current Every Day Smoker    Packs/day: 0.50    Types: Cigarettes  . Smokeless tobacco: Never Used  Substance and Sexual Activity  . Alcohol use: Yes    Alcohol/week: 1.0 standard drinks    Types: 1 Glasses of wine per week    Comment: ocassional use with dinner 1x a week  . Drug use: No  . Sexual activity: Yes    Partners: Male    Birth control/protection: Surgical  Lifestyle  . Physical activity:    Days per week: Not on file    Minutes per session: Not on file  . Stress: Not on file  Relationships  . Social connections:    Talks on phone: Not on file    Gets together: Not on file    Attends religious service: Not on file    Active member of club or organization: Not on file    Attends meetings of clubs or organizations: Not on file    Relationship status: Not on file  Other Topics Concern  . Not on file  Social History Narrative  . Not on file    Allergies: No Known Allergies  Metabolic Disorder Labs: No results found for: HGBA1C, MPG No results found for: PROLACTIN Lab Results  Component Value Date   CHOL 149 04/08/2012   TRIG 138 04/08/2012   HDL 36 (L) 04/08/2012   CHOLHDL 4.1 04/08/2012   VLDL 28 04/08/2012   LDLCALC 85 04/08/2012   LDLCALC 83 12/13/2011   Lab Results  Component Value Date   TSH 1.716 04/08/2012   TSH 1.518 12/12/2011    Therapeutic Level Labs: No results found for: LITHIUM No results found for: VALPROATE No components found for:  CBMZ  Current Medications: Current Outpatient Medications  Medication Sig Dispense Refill  . ALPRAZolam (XANAX) 1 MG tablet Take 1 tablet (1 mg total) by mouth 3 (three) times daily as needed for anxiety. For anxiety 90 tablet 3  . aspirin-acetaminophen-caffeine (EXCEDRIN MIGRAINE) 250-250-65 MG per tablet Take 1 tablet by mouth every 6 (six) hours as needed for pain.    . benazepril-hydrochlorthiazide (LOTENSIN HCT)  20-25 MG tablet Take 1 tablet by mouth daily.    Marland Kitchen ibuprofen (ADVIL,MOTRIN) 600 MG tablet Take 1 tablet (600 mg total) by mouth every 6 (six) hours as needed. 30 tablet 0  . loratadine (CLARITIN) 10 MG tablet Take 1 tablet (10 mg total) by mouth daily. 14 tablet 0  . metFORMIN (GLUCOPHAGE) 1000 MG tablet Take 2,000  mg by mouth at bedtime.    . vortioxetine HBr (TRINTELLIX) 20 MG TABS tablet Take 1 tablet (20 mg total) by mouth daily. 30 tablet 2   No current facility-administered medications for this visit.      Musculoskeletal: Strength & Muscle Tone: within normal limits Gait & Station: normal Patient leans: N/A  Psychiatric Specialty Exam: Review of Systems  Psychiatric/Behavioral: Positive for depression. The patient is nervous/anxious.   All other systems reviewed and are negative.   Blood pressure (!) 143/84, pulse (!) 102, height 5\' 3"  (1.6 m), weight 270 lb (122.5 kg), SpO2 99 %.Body mass index is 47.83 kg/m.  General Appearance: Casual and Fairly Groomed  Eye Contact:  Good  Speech:  Clear and Coherent  Volume:  Normal  Mood:  Anxious, Depressed, Hopeless and Worthless  Affect:  Depressed and Tearful  Thought Process:  Goal Directed  Orientation:  Full (Time, Place, and Person)  Thought Content: Rumination   Suicidal Thoughts:  Yes.  without intent/plan  Homicidal Thoughts:  No  Memory:  Immediate;   Good Recent;   Good Remote;   Good  Judgement:  Good  Insight:  Fair  Psychomotor Activity:  Decreased  Concentration:  Concentration: Poor and Attention Span: Poor  Recall:  Good  Fund of Knowledge: Good  Language: Good  Akathisia:  No  Handed:  Right  AIMS (if indicated): not done  Assets:  Communication Skills Desire for Improvement Physical Health Resilience Social Support Talents/Skills  ADL's:  Intact  Cognition: WNL  Sleep:  Fair   Screenings:   Assessment and Plan: This patient is a 48 year old female with a history of depression.  She was  stable for quite a while but her depression has recurred probably on the anniversary of her mother's death.  At this point she needs time off from work to regroup and get better so we will fill out FMLA forms for her.  I strongly advised her to enter our intensive outpatient program and we will have her screened for this.  She will taper off Lexapro and start Trintellix at 5 mg daily for a week then advance to 10 mg daily and eventually to 20 mg daily.  She will continue Xanax 1 mg 3 times daily for anxiety.  She will return to see me in 3 weeks or call sooner as needed   Diannia Rudereborah Ross, MD 06/26/2018, 9:44 AM

## 2018-06-27 ENCOUNTER — Telehealth (HOSPITAL_COMMUNITY): Payer: Self-pay | Admitting: Psychiatry

## 2018-07-03 ENCOUNTER — Telehealth (HOSPITAL_COMMUNITY): Payer: Self-pay | Admitting: *Deleted

## 2018-07-03 ENCOUNTER — Telehealth (HOSPITAL_COMMUNITY): Payer: Self-pay | Admitting: Psychiatry

## 2018-07-03 NOTE — Telephone Encounter (Signed)
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 She says her co pay even with the discount card her out of pocket is $ 290.00. She asked if she go back on Lexapro?

## 2018-07-03 NOTE — Telephone Encounter (Signed)
Left message, offered to switch to cymbalta

## 2018-07-05 NOTE — Telephone Encounter (Signed)
We can continue the samples for now

## 2018-07-05 NOTE — Telephone Encounter (Signed)
LVM stating that Dr Tenny Craw has LVM's & has offered to switch to Cymbalta. 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 Asked for a call back

## 2018-07-05 NOTE — Telephone Encounter (Signed)
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  LVM  Saying that you called & LVM  & has offered to switch to Cymbalta.  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  Asked for a call back so we know what she would like to do.  * Patient stated that she will continue with the Trintellix. She said the $290 is her co-pay after the discount coupon card
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
## 2018-07-24 ENCOUNTER — Encounter (HOSPITAL_COMMUNITY): Payer: Self-pay | Admitting: Psychiatry

## 2018-07-24 ENCOUNTER — Other Ambulatory Visit: Payer: Self-pay

## 2018-07-24 ENCOUNTER — Ambulatory Visit (INDEPENDENT_AMBULATORY_CARE_PROVIDER_SITE_OTHER): Payer: BLUE CROSS/BLUE SHIELD | Admitting: Psychiatry

## 2018-07-24 DIAGNOSIS — Z79899 Other long term (current) drug therapy: Secondary | ICD-10-CM

## 2018-07-24 DIAGNOSIS — F331 Major depressive disorder, recurrent, moderate: Secondary | ICD-10-CM | POA: Diagnosis not present

## 2018-07-24 DIAGNOSIS — F419 Anxiety disorder, unspecified: Secondary | ICD-10-CM

## 2018-07-24 MED ORDER — ZOLPIDEM TARTRATE 10 MG PO TABS: 10.0000 mg | ORAL_TABLET | Freq: Every evening | ORAL | 3 refills | Status: DC | PRN

## 2018-07-24 MED ORDER — VORTIOXETINE HBR 20 MG PO TABS: 20.0000 mg | ORAL_TABLET | Freq: Every day | ORAL | 2 refills | Status: DC

## 2018-07-24 MED ORDER — ALPRAZOLAM 1 MG PO TABS: 1.0000 mg | ORAL_TABLET | Freq: Three times a day (TID) | ORAL | 3 refills | Status: DC | PRN

## 2018-07-24 NOTE — Progress Notes (Signed)
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 She has polycystic ovary disease and had a difficult time getting pregnant. She went through several miscarriages. For the past 10 years or so she been on Prozac which helped but eventually it burned out. She was started on Lexapro 20 mg per day last March. She was doing well until about 3 or four weeks ago. 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 She gets upset and depressed when the patients die. Her own father died of cancer several years ago and this seems to be stirring up bad memories.  The patient returns after 4 weeks.  Last visit she was not doing well.  She was very depressed.  She was almost to the point of needing hospitalization and I tried to get her into intensive outpatient program but her insurance had a $5000 deductible for this and she could not afford it.  We did switch her from the Lexapro to Trintellix and although it was a bit rocky at first she is starting to feel better.  Her mood is improving as is her energy.  She went back on Ambien because she could not sleep without it.  Her anxiety is under fairly good control.  She is not having suicidal ideation.  She still has some moments of depression.  She has been out of work until today and I think she can return to work fairly soon and we agreed upon March 23.  She is agreeable also to seeing a counselor in our office Visit Diagnosis:    ICD-10-CM   1. Major depressive disorder, recurrent episode, moderate (HCC) F33.1 ALPRAZolam (XANAX) 1 MG tablet    Past Psychiatric History: Prior  outpatient treatment  Past Medical History:  Past Medical History:  Diagnosis Date  . Anxiety   . Asthma   . Chest pain    NO CAD by cath 12/12/11, nl LV fxn  . Depression   . Headache(784.0)   . Hypertriglyceridemia   . Obesity   . Polycystic ovarian disease   . Tobacco abuse     Past Surgical History:  Procedure Laterality Date  . CARDIAC CATHETERIZATION    . CESAREAN SECTION    . CHOLECYSTECTOMY    . DILATION AND CURETTAGE OF UTERUS    . ECTOPIC PREGNANCY SURGERY    . LEFT HEART CATHETERIZATION WITH CORONARY ANGIOGRAM N/A 12/12/2011   Procedure: LEFT HEART CATHETERIZATION WITH CORONARY ANGIOGRAM;  Surgeon: Tonny Bollman, MD;  Location: [facility] CATH LAB;  Service: Cardiovascular;  Laterality: N/A;  . TUBAL LIGATION      Family Psychiatric History: see below  Family History:  Family History  Problem Relation Age of Onset  . Depression Son   . ADD / ADHD Son   . Depression Paternal Aunt   . Depression Paternal Uncle     Social History:  Social History   Socioeconomic History  . Marital status: Legally Separated    Spouse name: Not on file  . Number of children: Not on file  . Years of education: Not on file  .  Highest education level: Not on file  Occupational History  . Not on file  Social Needs  . Financial resource strain: Not on file  . Food insecurity:    Worry: Not on file    Inability: Not on file  . Transportation needs:    Medical: Not on file    Non-medical: Not on file  Tobacco Use  . Smoking status: Current Every Day Smoker    Packs/day: 0.50    Years: 20.00    Pack years: 10.00    Types: Cigarettes  . Smokeless tobacco: Never Used  Substance and Sexual Activity  . Alcohol use: Not Currently    Comment: occasional use  . Drug use: No  . Sexual activity: Yes    Partners: Male    Birth control/protection: Surgical, I.U.D.  Lifestyle  . Physical activity:    Days per week: Not on file    Minutes per session: Not on file  . Stress: Not on  file  Relationships  . Social connections:    Talks on phone: Not on file    Gets together: Not on file    Attends religious service: Not on file    Active member of club or organization: Not on file    Attends meetings of clubs or organizations: Not on file    Relationship status: Not on file  Other Topics Concern  . Not on file  Social History Narrative  . Not on file    Allergies: No Known Allergies  Metabolic Disorder Labs: No results found for: HGBA1C, MPG No results found for: PROLACTIN Lab Results  Component Value Date   CHOL 149 04/08/2012   TRIG 138 04/08/2012   HDL 36 (L) 04/08/2012   CHOLHDL 4.1 04/08/2012   VLDL 28 04/08/2012   LDLCALC 85 04/08/2012   LDLCALC 83 12/13/2011   Lab Results  Component Value Date   TSH 1.716 04/08/2012   TSH 1.518 12/12/2011    Therapeutic Level Labs: No results found for: LITHIUM No results found for: VALPROATE No components found for:  CBMZ  Current Medications: Current Outpatient Medications  Medication Sig Dispense Refill  . ALPRAZolam (XANAX) 1 MG tablet Take 1 tablet (1 mg total) by mouth 3 (three) times daily as needed for anxiety. For anxiety 90 tablet 3  . benazepril-hydrochlorthiazide (LOTENSIN HCT) 20-25 MG tablet Take 1 tablet by mouth daily.    . cephALEXin (KEFLEX) 500 MG capsule Take 500 mg by mouth 2 (two) times daily.    Marland Kitchen ibuprofen (ADVIL,MOTRIN) 600 MG tablet Take 1 tablet (600 mg total) by mouth every 6 (six) hours as needed. 30 tablet 0  . metFORMIN (GLUCOPHAGE) 1000 MG tablet Take 2,000 mg by mouth at bedtime.    . vortioxetine HBr (TRINTELLIX) 20 MG TABS tablet Take 1 tablet (20 mg total) by mouth daily. 30 tablet 2  . aspirin-acetaminophen-caffeine (EXCEDRIN MIGRAINE) 250-250-65 MG per tablet Take 1 tablet by mouth every 6 (six) hours as needed for pain.    Marland Kitchen loratadine (CLARITIN) 10 MG tablet Take 1 tablet (10 mg total) by mouth daily. (Patient not taking: Reported on 07/24/2018) 14 tablet 0  .  zolpidem (AMBIEN) 10 MG tablet Take 1 tablet (10 mg total) by mouth at bedtime as needed for sleep. 30 tablet 3   No current facility-administered medications for this visit.      Musculoskeletal: Strength & Muscle Tone: within normal limits Gait & Station: normal Patient leans: N/A  Psychiatric Specialty Exam: Review of Systems  HENT: Positive for ear pain.   Psychiatric/Behavioral: Positive for depression. The patient is nervous/anxious.   All other systems reviewed and are negative.   Blood pressure 122/73, pulse 77, height 5\' 2"  (1.575 m), weight 268 lb (121.6 kg).Body mass index is 49.02 kg/m.  General Appearance: Casual and Fairly Groomed  Eye Contact:  Good  Speech:  Clear and Coherent  Volume:  Normal  Mood:  Anxious  Affect:  Appropriate and Constricted  Thought Process:  Goal Directed  Orientation:  Full (Time, Place, and Person)  Thought Content: Rumination   Suicidal Thoughts:  No  Homicidal Thoughts:  No  Memory:  Immediate;   Good Recent;   Good Remote;   Good  Judgement:  Fair  Insight:  Fair  Psychomotor Activity:  Decreased  Concentration:  Concentration: Good and Attention Span: Good  Recall:  Good  Fund of Knowledge: Good  Language: Good  Akathisia:  No  Handed:  Right  AIMS (if indicated): not done  Assets:  Communication Skills Desire for Improvement Physical Health Resilience Social Support Talents/Skills  ADL's:  Intact  Cognition: WNL  Sleep:  Fair   Screenings:   Assessment and Plan: This patient is a 48 year old female with a history of depression and anxiety.  She is doing much better with the 10 Trelex and will continue on 20 mg daily for depression as well as Xanax 1 mg 3 times daily for anxiety.  She will continue Ambien 10 mg at bedtime as needed for sleep.  We have agreed that she will try to return to work on 08/05/2018.  She will start seeing a counselor in our office and we will also go to some community support groups for  grief counseling.  She will return to see me in 4 weeks   Diannia Ruder, MD 07/24/2018, 2:06 PM

## 2018-07-25 ENCOUNTER — Telehealth (HOSPITAL_COMMUNITY): Payer: Self-pay

## 2018-07-25 NOTE — Telephone Encounter (Signed)
Medication management - Telephone call with pt 2 times today after she left a message requesting we sent a copy of the letter Dr. Tenny Craw prepared for her return to work date to her current boss, Emilio Aspen at [phone].  Copy sent to scan after faxed. Patient to call back if any problems.

## 2018-08-21 ENCOUNTER — Encounter (HOSPITAL_COMMUNITY): Payer: Self-pay | Admitting: Psychiatry

## 2018-08-21 ENCOUNTER — Other Ambulatory Visit (HOSPITAL_COMMUNITY): Payer: Self-pay | Admitting: Psychiatry

## 2018-08-21 ENCOUNTER — Ambulatory Visit (INDEPENDENT_AMBULATORY_CARE_PROVIDER_SITE_OTHER): Payer: BLUE CROSS/BLUE SHIELD | Admitting: Psychiatry

## 2018-08-21 ENCOUNTER — Other Ambulatory Visit: Payer: Self-pay

## 2018-08-21 DIAGNOSIS — F331 Major depressive disorder, recurrent, moderate: Secondary | ICD-10-CM

## 2018-08-21 MED ORDER — VORTIOXETINE HBR 20 MG PO TABS: 20.0000 mg | ORAL_TABLET | Freq: Every day | ORAL | 2 refills | Status: DC

## 2018-08-21 MED ORDER — ZOLPIDEM TARTRATE 10 MG PO TABS: 10.0000 mg | ORAL_TABLET | Freq: Every evening | ORAL | 3 refills | Status: DC | PRN

## 2018-08-21 MED ORDER — ALPRAZOLAM 1 MG PO TABS: 1.0000 mg | ORAL_TABLET | Freq: Three times a day (TID) | ORAL | 3 refills | Status: DC | PRN

## 2018-08-21 NOTE — Progress Notes (Signed)
Virtual Visit via Video Note  I connected with Natalie Walker on 08/21/18 at  1:20 PM EDT by a video enabled telemedicine application and verified that I am speaking with the correct person using two identifiers.   I discussed the limitations of evaluation and management by telemedicine and the availability of in person appointments. The patient expressed understanding and agreed to proceed.      I discussed the assessment and treatment plan with the patient. The patient was provided an opportunity to ask questions and all were answered. The patient agreed with the plan and demonstrated an understanding of the instructions.   The patient was advised to call back or seek an in-person evaluation if the symptoms worsen or if the condition fails to improve as anticipated.  I provided 15 minutes of non-face-to-face time during this encounter.   Diannia Ruder, MD  [facility] MD/PA/NP OP Progress Note  08/21/2018 1:37 PM Natalie Walker  MRN:  [id number]  Chief Complaint: depression QJJ:HERD patient is a 48 year old divorced white female who lives withher boyfriend and2 childrenin [place]. 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 She has polycystic ovary disease and had a difficult time getting pregnant. She went through several miscarriages. For the past 10 years or so she been on Prozac which helped but eventually it burned out. She was started on Lexapro 20 mg per day last March. She was doing well until about 3 or four weeks ago. 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 She gets upset and depressed when the patients die. Her own father died of cancer several years ago and this seems to be stirring up bad memories.  The patient returns after 1 month and is seen via telemedicine due to the coronavirus pandemic.  She states that she is doing much better.  About 2 months ago  she was extremely depressed and we were thinking about hospitalization but she was switched from Lexapro to Trintellix and she is doing much better now.  She states that her mood is good her energy is much improved.  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  She does not feel particularly worried or anxious about this but is taking the proper precautions.  Her children are staying with her father during the week so she does not get them exposed.  She states that she is sleeping well with the Ambien her mood is good and she is not experiencing any panic attacks severe anxiety or suicidal ideation. Visit Diagnosis:    ICD-10-CM   1. Major depressive disorder, recurrent episode, moderate (HCC) F33.1 ALPRAZolam (XANAX) 1 MG tablet    Past Psychiatric History: Prior outpatient treatment  Past Medical History:  Past Medical History:  Diagnosis Date  . Anxiety   . Asthma   . Chest pain    NO CAD by cath 12/12/11, nl LV fxn  . Depression   . Headache(784.0)   . Hypertriglyceridemia   . Obesity   . Polycystic ovarian disease   . Tobacco abuse     Past Surgical History:  Procedure Laterality Date  . CARDIAC CATHETERIZATION    . CESAREAN SECTION    . CHOLECYSTECTOMY    . DILATION AND CURETTAGE OF UTERUS    .  ECTOPIC PREGNANCY SURGERY    . LEFT HEART CATHETERIZATION WITH CORONARY ANGIOGRAM N/A 12/12/2011   Procedure: LEFT HEART CATHETERIZATION WITH CORONARY ANGIOGRAM;  Surgeon: Tonny BollmanMichael Cooper, MD;  [facility];  Service: Cardiovascular;  Laterality: N/A;  . TUBAL LIGATION      Family Psychiatric History: See below  Family History:  Family History  Problem Relation Age of Onset  . Depression Son   . ADD / ADHD Son   . Depression Paternal Aunt   . Depression Paternal Uncle     Social History:  Social History   Socioeconomic History  . Marital status: Legally Separated     Spouse name: Not on file  . Number of children: Not on file  . Years of education: Not on file  . Highest education level: Not on file  Occupational History  . Not on file  Social Needs  . Financial resource strain: Not on file  . Food insecurity:    Worry: Not on file    Inability: Not on file  . Transportation needs:    Medical: Not on file    Non-medical: Not on file  Tobacco Use  . Smoking status: Current Every Day Smoker    Packs/day: 0.50    Years: 20.00    Pack years: 10.00    Types: Cigarettes  . Smokeless tobacco: Never Used  Substance and Sexual Activity  . Alcohol use: Not Currently    Comment: occasional use  . Drug use: No  . Sexual activity: Yes    Partners: Male    Birth control/protection: Surgical, I.U.D.  Lifestyle  . Physical activity:    Days per week: Not on file    Minutes per session: Not on file  . Stress: Not on file  Relationships  . Social connections:    Talks on phone: Not on file    Gets together: Not on file    Attends religious service: Not on file    Active member of club or organization: Not on file    Attends meetings of clubs or organizations: Not on file    Relationship status: Not on file  Other Topics Concern  . Not on file  Social History Narrative  . Not on file    Allergies: No Known Allergies  Metabolic Disorder Labs: No results found for: HGBA1C, MPG No results found for: PROLACTIN Lab Results  Component Value Date   CHOL 149 04/08/2012   TRIG 138 04/08/2012   HDL 36 (L) 04/08/2012   CHOLHDL 4.1 04/08/2012   VLDL 28 04/08/2012   LDLCALC 85 04/08/2012   LDLCALC 83 12/13/2011   Lab Results  Component Value Date   TSH 1.716 04/08/2012   TSH 1.518 12/12/2011    Therapeutic Level Labs: No results found for: LITHIUM No results found for: VALPROATE No components found for:  CBMZ  Current Medications: Current Outpatient Medications  Medication Sig Dispense Refill  . ALPRAZolam (XANAX) 1 MG tablet Take 1  tablet (1 mg total) by mouth 3 (three) times daily as needed for anxiety. For anxiety 90 tablet 3  . aspirin-acetaminophen-caffeine (EXCEDRIN MIGRAINE) 250-250-65 MG per tablet Take 1 tablet by mouth every 6 (six) hours as needed for pain.    . benazepril-hydrochlorthiazide (LOTENSIN HCT) 20-25 MG tablet Take 1 tablet by mouth daily.    . cephALEXin (KEFLEX) 500 MG capsule Take 500 mg by mouth 2 (two) times daily.    Marland Kitchen. ibuprofen (ADVIL,MOTRIN) 600 MG tablet Take 1 tablet (600 mg total)  by mouth every 6 (six) hours as needed. 30 tablet 0  . loratadine (CLARITIN) 10 MG tablet Take 1 tablet (10 mg total) by mouth daily. (Patient not taking: Reported on 07/24/2018) 14 tablet 0  . metFORMIN (GLUCOPHAGE) 1000 MG tablet Take 2,000 mg by mouth at bedtime.    . vortioxetine HBr (TRINTELLIX) 20 MG TABS tablet Take 1 tablet (20 mg total) by mouth daily. 30 tablet 2  . zolpidem (AMBIEN) 10 MG tablet Take 1 tablet (10 mg total) by mouth at bedtime as needed for sleep. 30 tablet 3   No current facility-administered medications for this visit.      Musculoskeletal: Strength & Muscle Tone: within normal limits Gait & Station: normal Patient leans: N/A  Psychiatric Specialty Exam: Review of Systems  All other systems reviewed and are negative.   There were no vitals taken for this visit.There is no height or weight on file to calculate BMI.  General Appearance: Casual, Neat and Well Groomed  Eye Contact:  Good  Speech:  Clear and Coherent  Volume:  Normal  Mood:  Euthymic  Affect:  Congruent  Thought Process:  Goal Directed  Orientation:  Full (Time, Place, and Person)  Thought Content: Rumination   Suicidal Thoughts:  No  Homicidal Thoughts:  No  Memory:  Immediate;   Good Recent;   Good Remote;   Good  Judgement:  Good  Insight:  Fair  Psychomotor Activity: Normal  Concentration:  Concentration: Good and Attention Span: Good  Recall:  Good  Fund of Knowledge: Good  Language: Good   Akathisia:  No  Handed:  Right  AIMS (if indicated): not done  Assets:  Communication Skills Desire for Improvement Physical Health Resilience Social Support Talents/Skills  ADL's:  Intact  Cognition: WNL  Sleep:  Good   Screenings:   Assessment and Plan: This patient is a 48 year old female with a history of depression and anxiety.  She was having a really rough time a couple of months ago and was particularly grieving the loss of her mother.  However the change in medication seems to have been very helpful and she is back functioning at work.  She will continue Trintellix 20 mg daily for depression, Xanax 1 mg 3 times daily for anxiety and Ambien 10 mg at bedtime for sleep.  She will return to see me in 2 months   Diannia Ruder, MD 08/21/2018, 1:37 PM

## 2018-08-28 ENCOUNTER — Ambulatory Visit (HOSPITAL_COMMUNITY): Payer: BLUE CROSS/BLUE SHIELD | Admitting: Psychiatry

## 2018-08-28 ENCOUNTER — Other Ambulatory Visit: Payer: Self-pay

## 2018-10-22 ENCOUNTER — Telehealth (HOSPITAL_COMMUNITY): Payer: Self-pay | Admitting: *Deleted

## 2018-10-22 ENCOUNTER — Other Ambulatory Visit (HOSPITAL_COMMUNITY): Payer: Self-pay | Admitting: Psychiatry

## 2018-10-22 MED ORDER — FLUOXETINE HCL 20 MG PO CAPS: 20.0000 mg | ORAL_CAPSULE | Freq: Every day | ORAL | 2 refills | Status: DC

## 2018-10-22 NOTE — Telephone Encounter (Signed)
APPOINTMENT 10/22/2018

## 2018-10-22 NOTE — Telephone Encounter (Signed)
Dr Harrington Challenger Patient called stated that she is having "severe upset stomach " with the Trintellix.  And asked if she could change to Prozac?

## 2018-10-22 NOTE — Telephone Encounter (Signed)
Prozac sent in, she will need to make an appointment

## 2018-10-24 ENCOUNTER — Encounter (HOSPITAL_COMMUNITY): Payer: Self-pay | Admitting: Psychiatry

## 2018-10-24 ENCOUNTER — Other Ambulatory Visit: Payer: Self-pay

## 2018-10-24 ENCOUNTER — Ambulatory Visit (INDEPENDENT_AMBULATORY_CARE_PROVIDER_SITE_OTHER): Payer: Self-pay | Admitting: Psychiatry

## 2018-10-24 DIAGNOSIS — F331 Major depressive disorder, recurrent, moderate: Secondary | ICD-10-CM

## 2018-10-24 MED ORDER — ALPRAZOLAM 1 MG PO TABS: 1.0000 mg | ORAL_TABLET | Freq: Three times a day (TID) | ORAL | 3 refills | Status: DC | PRN

## 2018-10-24 MED ORDER — ZOLPIDEM TARTRATE 10 MG PO TABS: 10.0000 mg | ORAL_TABLET | Freq: Every evening | ORAL | 3 refills | Status: DC | PRN

## 2018-10-24 NOTE — Progress Notes (Signed)
BH MD/PA/NP OP Progress Note  10/24/2018 1:32 PM Natalie Walker  MRN:  [id number]  Chief Complaint:  Chief Complaint    Depression; Anxiety; Follow-up     HPI: This patient is a 48 year old divorced white female who lives [place] her boyfriend and 2 children [place].  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  The patient returns after 2 months for follow-up regarding depression.  She had been quite depressed several months ago and almost needed hospitalization because she was beginning to have suicidal thoughts.  She seemed to have a good response to Trintellix.  However she called a couple days ago and stated that the Trintellix was causing constant nausea cramps and diarrhea.  It also made her feel sluggish and "blah".  I have sent in Prozac 20 mg for her to switch to and she is picking that up today.  She denies inability to function serious depression suicidal ideation or self-harm thoughts.  She is going to a grief support group that is helping her deal with the loss of her mother last year.  She is sleeping well with the Ambien and the Xanax continues to help her anxiety.  She states that she had a good response to Prozac years back when she had postpartum depression. Visit Diagnosis:    ICD-10-CM   1. Major depressive disorder, recurrent episode, moderate (HCC)  F33.1 ALPRAZolam (XANAX) 1 MG tablet    Past Psychiatric History: Prior outpatient treatment  Past Medical History:  Past Medical History:  Diagnosis Date  . Anxiety   . Asthma   . Chest pain    NO CAD by cath 12/12/11, nl LV fxn  . Depression   . Headache(784.0)   . Hypertriglyceridemia   . Obesity   . Polycystic ovarian disease   . Tobacco abuse     Past Surgical History:  Procedure Laterality Date  . CARDIAC CATHETERIZATION    . CESAREAN SECTION    . CHOLECYSTECTOMY    . DILATION AND CURETTAGE OF UTERUS    . ECTOPIC PREGNANCY SURGERY    . LEFT HEART CATHETERIZATION WITH CORONARY ANGIOGRAM N/A 12/12/2011   Procedure: LEFT HEART CATHETERIZATION WITH CORONARY ANGIOGRAM;  Surgeon: Tonny BollmanMichael Cooper, MD;  [facility];  Service: Cardiovascular;  Laterality: N/A;  . TUBAL LIGATION      Family Psychiatric History: see below  Family History:  Family History  Problem Relation Age of Onset  . Depression Son   . ADD / ADHD Son   . Depression Paternal Aunt   . Depression Paternal Uncle     Social History:  Social History   Socioeconomic History  . Marital status: Legally Separated    Spouse name: Not on file  . Number of children: Not on file  . Years of education: Not on file  . Highest education level: Not on file  Occupational History  . Not on file  Social Needs  . Financial resource strain: Not on file  . Food insecurity    Worry: Not on file    Inability: Not on file  . Transportation needs    Medical: Not on file    Non-medical: Not on file  Tobacco Use  . Smoking status: Current Every Day Smoker    Packs/day: 0.50    Years: 20.00    Pack years: 10.00    Types: Cigarettes  . Smokeless tobacco: Never Used  Substance and Sexual Activity  . Alcohol use: Not Currently  Comment: occasional use  . Drug use: No  . Sexual activity: Yes    Partners: Male    Birth control/protection: Surgical, I.U.D.  Lifestyle  . Physical activity    Days per week: Not on file    Minutes per session: Not on file  . Stress: Not on file  Relationships  . Social Herbalist on phone: Not on file    Gets together: Not on file    Attends religious service: Not on file    Active member of club or organization: Not on file    Attends meetings of clubs or organizations: Not on file    Relationship status: Not on file  Other Topics Concern  . Not on file  Social History Narrative  . Not on file    Allergies: No Known Allergies  Metabolic Disorder Labs: No results found for: HGBA1C, MPG No results found for: PROLACTIN Lab Results  Component Value Date   CHOL 149  04/08/2012   TRIG 138 04/08/2012   HDL 36 (L) 04/08/2012   CHOLHDL 4.1 04/08/2012   VLDL 28 04/08/2012   LDLCALC 85 04/08/2012   LDLCALC 83 12/13/2011   Lab Results  Component Value Date   TSH 1.716 04/08/2012   TSH 1.518 12/12/2011    Therapeutic Level Labs: No results found for: LITHIUM No results found for: VALPROATE No components found for:  CBMZ  Current Medications: Current Outpatient Medications  Medication Sig Dispense Refill  . ALPRAZolam (XANAX) 1 MG tablet Take 1 tablet (1 mg total) by mouth 3 (three) times daily as needed for anxiety. For anxiety 90 tablet 3  . aspirin-acetaminophen-caffeine (EXCEDRIN MIGRAINE) 250-250-65 MG per tablet Take 1 tablet by mouth every 6 (six) hours as needed for pain.    . benazepril-hydrochlorthiazide (LOTENSIN HCT) 20-25 MG tablet Take 1 tablet by mouth daily.    . cephALEXin (KEFLEX) 500 MG capsule Take 500 mg by mouth 2 (two) times daily.    Marland Kitchen FLUoxetine (PROZAC) 20 MG capsule Take 1 capsule (20 mg total) by mouth daily. 30 capsule 2  . ibuprofen (ADVIL,MOTRIN) 600 MG tablet Take 1 tablet (600 mg total) by mouth every 6 (six) hours as needed. 30 tablet 0  . loratadine (CLARITIN) 10 MG tablet Take 1 tablet (10 mg total) by mouth daily. (Patient not taking: Reported on 07/24/2018) 14 tablet 0  . metFORMIN (GLUCOPHAGE) 1000 MG tablet Take 2,000 mg by mouth at bedtime.    Marland Kitchen zolpidem (AMBIEN) 10 MG tablet Take 1 tablet (10 mg total) by mouth at bedtime as needed for sleep. 30 tablet 3   No current facility-administered medications for this visit.      Musculoskeletal: Strength & Muscle Tone: within normal limits Gait & Station: normal Patient leans: N/A  Psychiatric Specialty Exam: Review of Systems  Psychiatric/Behavioral: Positive for depression.  All other systems reviewed and are negative.   There were no vitals taken for this visit.There is no height or weight on file to calculate BMI.  General Appearance: Casual, Neat and  Well Groomed  Eye Contact:  Good  Speech:  Clear and Coherent  Volume:  Normal  Mood: Dysphoric  Affect:  Depressed  Thought Process:  Goal Directed  Orientation:  Full (Time, Place, and Person)  Thought Content: Rumination   Suicidal Thoughts:  No  Homicidal Thoughts:  No  Memory:  Immediate;   Good Recent;   Good Remote;   Good  Judgement:  Good  Insight:  Good  Psychomotor Activity:  Normal  Concentration:  Concentration: Good and Attention Span: Good  Recall:  Good  Fund of Knowledge: Good  Language: Good  Akathisia:  No  Handed:  Right  AIMS (if indicated): not done  Assets:  Communication Skills Desire for Improvement Resilience Social Support Talents/Skills Vocational/Educational  ADL's:  Intact  Cognition: WNL  Sleep:  Good   Screenings:   Assessment and Plan: This patient is a 48 year old female with a history of depression and anxiety.  She went through a very difficult time after her mother died last year and this got very bad last February.  However she is now having trouble tolerating Trintellix and we will switch to Prozac 20 mg daily for depression.  She denies seriousness of symptoms that she has had in the recent past and she is working and taking care of her family.  She will also continue Xanax 1 mg 3 times daily for anxiety and Ambien 10 mg at bedtime for sleep.  She will return to see me in 4 weeks.   Diannia Rudereborah Torianna Junio, MD 10/24/2018, 1:32 PM

## 2018-11-22 ENCOUNTER — Other Ambulatory Visit: Payer: Self-pay

## 2018-11-22 ENCOUNTER — Ambulatory Visit (INDEPENDENT_AMBULATORY_CARE_PROVIDER_SITE_OTHER): Payer: Self-pay | Admitting: Psychiatry

## 2018-11-22 ENCOUNTER — Encounter (HOSPITAL_COMMUNITY): Payer: Self-pay | Admitting: Psychiatry

## 2018-11-22 DIAGNOSIS — F331 Major depressive disorder, recurrent, moderate: Secondary | ICD-10-CM

## 2018-11-22 MED ORDER — FLUOXETINE HCL 20 MG PO CAPS: 20.0000 mg | ORAL_CAPSULE | Freq: Every day | ORAL | 2 refills | Status: DC

## 2018-11-22 MED ORDER — ALPRAZOLAM 1 MG PO TABS: 1.0000 mg | ORAL_TABLET | Freq: Three times a day (TID) | ORAL | 3 refills | Status: DC | PRN

## 2018-11-22 MED ORDER — ZOLPIDEM TARTRATE 10 MG PO TABS: 10.0000 mg | ORAL_TABLET | Freq: Every evening | ORAL | 3 refills | Status: DC | PRN

## 2018-11-22 NOTE — Progress Notes (Signed)
Virtual Visit via Video Note  I connected with Natalie Walker on 11/22/18 at 10:00 AM EDT by a video enabled telemedicine application and verified that I am speaking with the correct person using two identifiers.   I discussed the limitations of evaluation and management by telemedicine and the availability of in person appointments. The patient expressed understanding and agreed to proceed.     I discussed the assessment and treatment plan with the patient. The patient was provided an opportunity to ask questions and all were answered. The patient agreed with the plan and demonstrated an understanding of the instructions.   The patient was advised to call back or seek an in-person evaluation if the symptoms worsen or if the condition fails to improve as anticipated.  I provided 15 minutes of non-face-to-face time during this encounter.   Diannia Rudereborah Dedee Liss, MD  [facility] OP Progress Note  11/22/2018 10:13 AM Natalie Walker  MRN:  [id number]  Chief Complaint:  Chief Complaint    Depression; Anxiety; Follow-up     HPI: This patient is a 48 year old divorced white female who lives with her boyfriend and 2 children [place].  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.  The patient returns after 4 weeks.  About 6 weeks ago we changed from Trintellix to Prozac because she was having nausea diarrhea fatigue with the Trintellix.  She states that the Prozac is doing much better.  Her energy is improved and she no longer feels so fatigued.  The nausea has stopped.  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  She denies serious symptoms of depression or suicidal ideation.  She does have a bit of anxiety but that Xanax helps.  She is sleeping well with Ambien.  She continues to go to her grief support group. Visit Diagnosis:    ICD-10-CM   1. Major depressive disorder, recurrent episode, moderate (HCC)  F33.1 ALPRAZolam (XANAX) 1 MG tablet    Past Psychiatric History:  Prior outpatient treatment  Past Medical History:  Past Medical History:  Diagnosis Date  . Anxiety   . Asthma   . Chest pain    NO CAD by cath 12/12/11, nl LV fxn  . Depression   . Headache(784.0)   . Hypertriglyceridemia   . Obesity   . Polycystic ovarian disease   . Tobacco abuse     Past Surgical History:  Procedure Laterality Date  . CARDIAC CATHETERIZATION    . CESAREAN SECTION    . CHOLECYSTECTOMY    . DILATION AND CURETTAGE OF UTERUS    . ECTOPIC PREGNANCY SURGERY    . LEFT HEART CATHETERIZATION WITH CORONARY ANGIOGRAM N/A 12/12/2011   Procedure: LEFT HEART CATHETERIZATION WITH CORONARY ANGIOGRAM;  Surgeon: Tonny BollmanMichael Cooper, MD;  [facility];  Service: Cardiovascular;  Laterality: N/A;  . TUBAL LIGATION      Family Psychiatric History: See below  Family History:  Family History  Problem Relation Age of Onset  . Depression Son   . ADD / ADHD Son   . Depression Paternal Aunt   . Depression Paternal Uncle     Social History:  Social History   Socioeconomic History  . Marital status: Legally Separated    Spouse name: Not on file  . Number of children: Not on file  . Years of education: Not on file  . Highest education level: Not on file  Occupational History  . Not on file  Social Needs  . Financial resource strain: Not on file  . Food insecurity    Worry: Not on file    Inability: Not on file  . Transportation needs    Medical: Not on file    Non-medical: Not on file  Tobacco Use  . Smoking status: Current Every Day Smoker    Packs/day: 0.50    Years: 20.00    Pack years: 10.00    Types: Cigarettes  . Smokeless tobacco: Never Used  Substance and Sexual Activity  . Alcohol use: Not Currently    Comment: occasional use  . Drug use: No  . Sexual activity: Yes    Partners: Male    Birth control/protection: Surgical, I.U.D.  Lifestyle  . Physical activity    Days per week: Not on file    Minutes per session: Not on file  . Stress: Not  on file  Relationships  . Social Musicianconnections    Talks on phone: Not on file    Gets together: Not on file    Attends religious service: Not on file    Active member of club or organization: Not on file    Attends meetings of clubs or organizations: Not on file    Relationship status: Not on file  Other Topics Concern  . Not on file  Social History Narrative  . Not on file    Allergies: No Known Allergies  Metabolic Disorder Labs: No results found for: HGBA1C, MPG No results found for: PROLACTIN Lab Results  Component Value Date   CHOL 149 04/08/2012   TRIG 138 04/08/2012   HDL 36 (L) 04/08/2012   CHOLHDL 4.1 04/08/2012   VLDL 28 04/08/2012   LDLCALC 85 04/08/2012   LDLCALC 83 12/13/2011   Lab Results  Component Value Date   TSH 1.716 04/08/2012   TSH 1.518 12/12/2011    Therapeutic Level Labs: No results found for: LITHIUM No results found for: VALPROATE No components found for:  CBMZ  Current Medications: Current Outpatient Medications  Medication Sig Dispense Refill  . ALPRAZolam (XANAX) 1 MG tablet Take 1 tablet (1 mg total) by mouth 3 (three) times daily as needed for anxiety. For anxiety 90 tablet 3  . aspirin-acetaminophen-caffeine (EXCEDRIN MIGRAINE) 250-250-65 MG per tablet Take 1 tablet by mouth every 6 (six) hours as needed for pain.    . benazepril-hydrochlorthiazide (LOTENSIN HCT) 20-25 MG tablet Take 1 tablet by mouth daily.    . cephALEXin (KEFLEX) 500 MG capsule Take 500 mg by mouth 2 (two) times daily.    Marland Kitchen. FLUoxetine (PROZAC) 20 MG capsule Take 1 capsule (20 mg total) by mouth daily. 30 capsule 2  . ibuprofen (ADVIL,MOTRIN) 600 MG tablet Take 1 tablet (600 mg total) by mouth every 6 (six) hours as needed. 30 tablet 0  . loratadine (CLARITIN) 10 MG tablet Take 1 tablet (10 mg total) by mouth daily. (Patient not taking: Reported on 07/24/2018) 14 tablet 0  . metFORMIN (GLUCOPHAGE) 1000 MG tablet Take 2,000 mg by mouth at bedtime.    Marland Kitchen. zolpidem  (AMBIEN) 10 MG tablet Take 1 tablet (10 mg total) by mouth at bedtime as needed for sleep. 30 tablet 3   No current facility-administered medications for this visit.      Musculoskeletal: Strength & Muscle Tone: within normal limits Gait & Station: normal Patient leans: N/A  Psychiatric Specialty Exam: Review of Systems  Psychiatric/Behavioral: The patient is nervous/anxious.   All other systems reviewed and are negative.   There  were no vitals taken for this visit.There is no height or weight on file to calculate BMI.  General Appearance: Casual and Fairly Groomed  Eye Contact:  Good  Speech:  Clear and Coherent  Volume:  Normal  Mood:  Euthymic  Affect:  Appropriate and Congruent  Thought Process:  Goal Directed  Orientation:  Full (Time, Place, and Person)  Thought Content: WDL   Suicidal Thoughts:  No  Homicidal Thoughts:  No  Memory:  Immediate;   Good Recent;   Good Remote;   Good  Judgement:  Good  Insight:  Good  Psychomotor Activity:  Normal  Concentration:  Concentration: Good and Attention Span: Good  Recall:  Good  Fund of Knowledge: Good  Language: Good  Akathisia:  No  Handed:  Right  AIMS (if indicated): not done  Assets:  Communication Skills Desire for Improvement Physical Health Resilience Social Support Talents/Skills  ADL's:  Intact  Cognition: WNL  Sleep:  Good   Screenings:   Assessment and Plan: This patient is a 48 year old female with a history of depression and anxiety.  She had become more depressed a few months ago after the death of her mother.  For a while she responded to Trintellix but it caused too many side effects.  She is doing well now on Prozac 20 mg daily.  She will also continue Xanax 1 mg 3 times daily as needed for anxiety and Ambien 10 mg at bedtime for sleep.  She will return to see me in 3 months   [name], MD 11/22/2018, 10:13 AM

## 2018-12-04 ENCOUNTER — Telehealth (HOSPITAL_COMMUNITY): Payer: Self-pay | Admitting: Psychiatry

## 2019-02-17 ENCOUNTER — Other Ambulatory Visit (HOSPITAL_COMMUNITY): Payer: Self-pay | Admitting: Psychiatry

## 2019-03-06 ENCOUNTER — Ambulatory Visit (INDEPENDENT_AMBULATORY_CARE_PROVIDER_SITE_OTHER): Payer: Medicaid - Out of State | Admitting: Psychiatry

## 2019-03-06 ENCOUNTER — Other Ambulatory Visit: Payer: Self-pay

## 2019-03-06 ENCOUNTER — Encounter (HOSPITAL_COMMUNITY): Payer: Self-pay | Admitting: Psychiatry

## 2019-03-06 DIAGNOSIS — F331 Major depressive disorder, recurrent, moderate: Secondary | ICD-10-CM

## 2019-03-06 MED ORDER — ZOLPIDEM TARTRATE 10 MG PO TABS: 10.0000 mg | ORAL_TABLET | Freq: Every evening | ORAL | 3 refills | Status: DC | PRN

## 2019-03-06 MED ORDER — FLUOXETINE HCL 40 MG PO CAPS: 40.0000 mg | ORAL_CAPSULE | Freq: Every day | ORAL | 2 refills | Status: DC

## 2019-03-06 MED ORDER — ALPRAZOLAM 1 MG PO TABS: 1.0000 mg | ORAL_TABLET | Freq: Three times a day (TID) | ORAL | 3 refills | Status: DC | PRN

## 2019-03-06 NOTE — Progress Notes (Signed)
[facility] MD/PA/NP OP Progress Note  03/06/2019 9:33 AM Natalie Walker  MRN:  [id number]  Chief Complaint:  Chief Complaint    Depression; Anxiety; Follow-up     HPI: This patient is a 48 year old divorced white female who lives with her husband and 2 children in [place].  She works as a Quarry manager for [employer].  The patient returns after 3 months.  She states that she is doing okay but feels like her mood could be better.  She is still grieving the loss of her mother whom she lost in February 2019.  She states that she is functioning well going to work taking care of her kids etc. but still feels like she is sluggish and sad at times.  She is definitely not suicidal.  She is sleeping well and the Xanax is helping her anxiety.  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  Nevertheless she thinks she might do better on a higher dose of Prozac so I suggested we go up to the 40 mg and she agrees. Visit Diagnosis:    ICD-10-CM   1. Major depressive disorder, recurrent episode, moderate (HCC)  F33.1 ALPRAZolam (XANAX) 1 MG tablet    Past Psychiatric History: Prior outpatient treatment  Past Medical History:  Past Medical History:  Diagnosis Date  . Anxiety   . Asthma   . Chest pain    NO CAD by cath 12/12/11, nl LV fxn  . Depression   . Headache(784.0)   . Hypertriglyceridemia   . Obesity   . Polycystic ovarian disease   . Tobacco abuse     Past Surgical History:  Procedure Laterality Date  . CARDIAC CATHETERIZATION    . CESAREAN SECTION    . CHOLECYSTECTOMY    . DILATION AND CURETTAGE OF UTERUS    . ECTOPIC PREGNANCY SURGERY    . LEFT HEART CATHETERIZATION WITH CORONARY ANGIOGRAM N/A 12/12/2011   Procedure: LEFT HEART CATHETERIZATION WITH CORONARY ANGIOGRAM;  Surgeon: Sherren Mocha, MD;  [facility];  Service: Cardiovascular;  Laterality: N/A;  . TUBAL LIGATION      Family Psychiatric History: See below  Family History:   Family History  Problem Relation Age of Onset  . Depression Son   . ADD / ADHD Son   . Depression Paternal Aunt   . Depression Paternal Uncle     Social History:  Social History   Socioeconomic History  . Marital status: Legally Separated    Spouse name: Not on file  . Number of children: Not on file  . Years of education: Not on file  . Highest education level: Not on file  Occupational History  . Not on file  Social Needs  . Financial resource strain: Not on file  . Food insecurity    Worry: Not on file    Inability: Not on file  . Transportation needs    Medical: Not on file    Non-medical: Not on file  Tobacco Use  . Smoking status: Current Every Day Smoker    Packs/day: 0.50    Years: 20.00    Pack years: 10.00    Types: Cigarettes  . Smokeless tobacco: Never Used  Substance and Sexual Activity  . Alcohol use: Not Currently    Comment: occasional use  . Drug use: No  . Sexual activity: Yes    Partners: Male    Birth control/protection: Surgical, I.U.D.  Lifestyle  .  Physical activity    Days per week: Not on file    Minutes per session: Not on file  . Stress: Not on file  Relationships  . Social Musicianconnections    Talks on phone: Not on file    Gets together: Not on file    Attends religious service: Not on file    Active member of club or organization: Not on file    Attends meetings of clubs or organizations: Not on file    Relationship status: Not on file  Other Topics Concern  . Not on file  Social History Narrative  . Not on file    Allergies: No Known Allergies  Metabolic Disorder Labs: No results found for: HGBA1C, MPG No results found for: PROLACTIN Lab Results  Component Value Date   CHOL 149 04/08/2012   TRIG 138 04/08/2012   HDL 36 (L) 04/08/2012   CHOLHDL 4.1 04/08/2012   VLDL 28 04/08/2012   LDLCALC 85 04/08/2012   LDLCALC 83 12/13/2011   Lab Results  Component Value Date   TSH 1.716 04/08/2012   TSH 1.518 12/12/2011     Therapeutic Level Labs: No results found for: LITHIUM No results found for: VALPROATE No components found for:  CBMZ  Current Medications: Current Outpatient Medications  Medication Sig Dispense Refill  . ALPRAZolam (XANAX) 1 MG tablet Take 1 tablet (1 mg total) by mouth 3 (three) times daily as needed for anxiety. For anxiety 90 tablet 3  . aspirin-acetaminophen-caffeine (EXCEDRIN MIGRAINE) 250-250-65 MG per tablet Take 1 tablet by mouth every 6 (six) hours as needed for pain.    . benazepril-hydrochlorthiazide (LOTENSIN HCT) 20-25 MG tablet Take 1 tablet by mouth daily.    . cephALEXin (KEFLEX) 500 MG capsule Take 500 mg by mouth 2 (two) times daily.    Marland Kitchen. FLUoxetine (PROZAC) 40 MG capsule Take 1 capsule (40 mg total) by mouth daily. 30 capsule 2  . ibuprofen (ADVIL,MOTRIN) 600 MG tablet Take 1 tablet (600 mg total) by mouth every 6 (six) hours as needed. 30 tablet 0  . loratadine (CLARITIN) 10 MG tablet Take 1 tablet (10 mg total) by mouth daily. (Patient not taking: Reported on 07/24/2018) 14 tablet 0  . metFORMIN (GLUCOPHAGE) 1000 MG tablet Take 2,000 mg by mouth at bedtime.    Marland Kitchen. zolpidem (AMBIEN) 10 MG tablet Take 1 tablet (10 mg total) by mouth at bedtime as needed for sleep. 30 tablet 3   No current facility-administered medications for this visit.      Musculoskeletal: Strength & Muscle Tone: within normal limits Gait & Station: normal Patient leans: N/A  Psychiatric Specialty Exam: Review of Systems  Psychiatric/Behavioral: Positive for depression.  All other systems reviewed and are negative.   There were no vitals taken for this visit.There is no height or weight on file to calculate BMI.  General Appearance: NA  Eye Contact:  NA  Speech:  Clear and Coherent  Volume:  Normal  Mood:  Dysphoric  Affect:  NA  Thought Process:  Goal Directed  Orientation:  Full (Time, Place, and Person)  Thought Content: Rumination   Suicidal Thoughts:  No  Homicidal Thoughts:   No  Memory:  Immediate;   Good Recent;   Good Remote;   Good  Judgement:  Good  Insight:  Good  Psychomotor Activity:  Decreased  Concentration:  Concentration: Good and Attention Span: Good  Recall:  Good  Fund of Knowledge: Good  Language: Good  Akathisia:  No  Handed:  Right  AIMS (if indicated): not done  Assets:  Communication Skills Desire for Improvement Physical Health Resilience Social Support Talents/Skills  ADL's:  Intact  Cognition: WNL  Sleep:  Good   Screenings:   Assessment and Plan: This patient is a 48 year old female with a history of depression and anxiety.  Her depression is definitely better but she does not feel like she is "all the way there."  We will therefore increase Prozac to 40 mg daily.  She will continue Xanax 1 mg 3 times daily as needed for anxiety and Ambien 10 mg at bedtime for sleep.  She will return to see me in 3 months   Diannia Ruder, MD 03/06/2019, 9:33 AM

## 2019-06-12 ENCOUNTER — Encounter (HOSPITAL_COMMUNITY): Payer: Self-pay | Admitting: Psychiatry

## 2019-06-12 ENCOUNTER — Other Ambulatory Visit: Payer: Self-pay

## 2019-06-12 ENCOUNTER — Ambulatory Visit (INDEPENDENT_AMBULATORY_CARE_PROVIDER_SITE_OTHER): Payer: BLUE CROSS/BLUE SHIELD | Admitting: Psychiatry

## 2019-06-12 DIAGNOSIS — F331 Major depressive disorder, recurrent, moderate: Secondary | ICD-10-CM | POA: Diagnosis not present

## 2019-06-12 MED ORDER — ALPRAZOLAM 1 MG PO TABS: 1.0000 mg | ORAL_TABLET | Freq: Three times a day (TID) | ORAL | 3 refills | Status: DC | PRN

## 2019-06-12 MED ORDER — FLUOXETINE HCL 40 MG PO CAPS: 40.0000 mg | ORAL_CAPSULE | Freq: Every day | ORAL | 2 refills | Status: DC

## 2019-06-12 MED ORDER — ZOLPIDEM TARTRATE 10 MG PO TABS: 10.0000 mg | ORAL_TABLET | Freq: Every evening | ORAL | 3 refills | Status: DC | PRN

## 2019-06-12 NOTE — Progress Notes (Signed)
Virtual Visit via Telephone Note  I connected with Natalie Walker on 06/12/19 at  2:00 PM EST by telephone and verified that I am speaking with the correct person using two identifiers.   I discussed the limitations, risks, security and privacy concerns of performing an evaluation and management service by telephone and the availability of in person appointments. I also discussed with the patient that there may be a patient responsible charge related to this service. The patient expressed understanding and agreed to proceed.    I discussed the assessment and treatment plan with the patient. The patient was provided an opportunity to ask questions and all were answered. The patient agreed with the plan and demonstrated an understanding of the instructions.   The patient was advised to call back or seek an in-person evaluation if the symptoms worsen or if the condition fails to improve as anticipated.  I provided 15 minutes of non-face-to-face time during this encounter.   Diannia Ruder, MD  [facility] MD/PA/NP OP Progress Note  06/12/2019 2:19 PM Natalie Walker  MRN:  [id number]  Chief Complaint:  Chief Complaint    Depression; Anxiety; Follow-up     HPI: This patient is a 49 year old married white female who lives with her husband and 2 children in [place].  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  The patient returns after 3 months.  She states for the most part she is doing pretty well.  She was feeling very sad about the loss of her mother in February 2019 but the increase in Prozac has helped.  She enjoys her job taking care of an elderly patient and is grateful she has not had to work in a nursing home or hospice and be exposed to coronavirus.  Her family is generally doing fairly well although her children are not very motivated with the online schooling.  She states that her mood has been stable she is sleeping well her energy is good.  She denies any recent panic attacks and the Xanax  continues to help her anxiety.  She is sleeping well with the Ambien Visit Diagnosis:    ICD-10-CM   1. Major depressive disorder, recurrent episode, moderate (HCC)  F33.1 ALPRAZolam (XANAX) 1 MG tablet    Past Psychiatric History: Past outpatient treatment in our clinic  Past Medical History:  Past Medical History:  Diagnosis Date  . Anxiety   . Asthma   . Chest pain    NO CAD by cath 12/12/11, nl LV fxn  . Depression   . Headache(784.0)   . Hypertriglyceridemia   . Obesity   . Polycystic ovarian disease   . Tobacco abuse     Past Surgical History:  Procedure Laterality Date  . CARDIAC CATHETERIZATION    . CESAREAN SECTION    . CHOLECYSTECTOMY    . DILATION AND CURETTAGE OF UTERUS    . ECTOPIC PREGNANCY SURGERY    . LEFT HEART CATHETERIZATION WITH CORONARY ANGIOGRAM N/A 12/12/2011   Procedure: LEFT HEART CATHETERIZATION WITH CORONARY ANGIOGRAM;  Surgeon: Tonny Bollman, MD;  Location: [facility] CATH LAB;  Service: Cardiovascular;  Laterality: N/A;  . TUBAL LIGATION      Family Psychiatric History: See below  Family History:  Family History  Problem Relation Age of Onset  . Depression Son   . ADD / ADHD Son   . Depression Paternal Aunt   . Depression Paternal Uncle     Social History:  Social History   Socioeconomic History  .  Marital status: Legally Separated    Spouse name: Not on file  . Number of children: Not on file  . Years of education: Not on file  . Highest education level: Not on file  Occupational History  . Not on file  Tobacco Use  . Smoking status: Current Every Day Smoker    Packs/day: 0.50    Years: 20.00    Pack years: 10.00    Types: Cigarettes  . Smokeless tobacco: Never Used  Substance and Sexual Activity  . Alcohol use: Not Currently    Comment: occasional use  . Drug use: No  . Sexual activity: Yes    Partners: Male    Birth control/protection: Surgical, I.U.D.  Other Topics Concern  . Not on file  Social History Narrative  . Not  on file   Social Determinants of Health   Financial Resource Strain:   . Difficulty of Paying Living Expenses: Not on file  Food Insecurity:   . Worried About Programme researcher, broadcasting/film/video in the Last Year: Not on file  . Ran Out of Food in the Last Year: Not on file  Transportation Needs:   . Lack of Transportation (Medical): Not on file  . Lack of Transportation (Non-Medical): Not on file  Physical Activity:   . Days of Exercise per Week: Not on file  . Minutes of Exercise per Session: Not on file  Stress:   . Feeling of Stress : Not on file  Social Connections:   . Frequency of Communication with Friends and Family: Not on file  . Frequency of Social Gatherings with Friends and Family: Not on file  . Attends Religious Services: Not on file  . Active Member of Clubs or Organizations: Not on file  . Attends Banker Meetings: Not on file  . Marital Status: Not on file    Allergies: No Known Allergies  Metabolic Disorder Labs: No results found for: HGBA1C, MPG No results found for: PROLACTIN Lab Results  Component Value Date   CHOL 149 04/08/2012   TRIG 138 04/08/2012   HDL 36 (L) 04/08/2012   CHOLHDL 4.1 04/08/2012   VLDL 28 04/08/2012   LDLCALC 85 04/08/2012   LDLCALC 83 12/13/2011   Lab Results  Component Value Date   TSH 1.716 04/08/2012   TSH 1.518 12/12/2011    Therapeutic Level Labs: No results found for: LITHIUM No results found for: VALPROATE No components found for:  CBMZ  Current Medications: Current Outpatient Medications  Medication Sig Dispense Refill  . ALPRAZolam (XANAX) 1 MG tablet Take 1 tablet (1 mg total) by mouth 3 (three) times daily as needed for anxiety. For anxiety 90 tablet 3  . aspirin-acetaminophen-caffeine (EXCEDRIN MIGRAINE) 250-250-65 MG per tablet Take 1 tablet by mouth every 6 (six) hours as needed for pain.    . benazepril-hydrochlorthiazide (LOTENSIN HCT) 20-25 MG tablet Take 1 tablet by mouth daily.    . cephALEXin  (KEFLEX) 500 MG capsule Take 500 mg by mouth 2 (two) times daily.    Marland Kitchen FLUoxetine (PROZAC) 40 MG capsule Take 1 capsule (40 mg total) by mouth daily. 30 capsule 2  . ibuprofen (ADVIL,MOTRIN) 600 MG tablet Take 1 tablet (600 mg total) by mouth every 6 (six) hours as needed. 30 tablet 0  . loratadine (CLARITIN) 10 MG tablet Take 1 tablet (10 mg total) by mouth daily. (Patient not taking: Reported on 07/24/2018) 14 tablet 0  . metFORMIN (GLUCOPHAGE) 1000 MG tablet Take 2,000 mg by mouth at  bedtime.    Marland Kitchen zolpidem (AMBIEN) 10 MG tablet Take 1 tablet (10 mg total) by mouth at bedtime as needed for sleep. 30 tablet 3   No current facility-administered medications for this visit.     Musculoskeletal: Strength & Muscle Tone: within normal limits Gait & Station: normal Patient leans: N/A  Psychiatric Specialty Exam: Review of Systems  All other systems reviewed and are negative.   There were no vitals taken for this visit.There is no height or weight on file to calculate BMI.  General Appearance: NA  Eye Contact:  NA  Speech:  Clear and Coherent  Volume:  Normal  Mood:  Euthymic  Affect:  NA  Thought Process:  Goal Directed  Orientation:  Full (Time, Place, and Person)  Thought Content: WDL   Suicidal Thoughts:  No  Homicidal Thoughts:  No  Memory:  Immediate;   Good Recent;   Good Remote;   Good  Judgement:  Good  Insight:  Good  Psychomotor Activity:  Normal  Concentration:  Concentration: Good and Attention Span: Good  Recall:  Good  Fund of Knowledge: Good  Language: Good  Akathisia:  No  Handed:  Right  AIMS (if indicated): not done  Assets:  Communication Skills Desire for Improvement Physical Health Resilience Social Support Talents/Skills  ADL's:  Intact  Cognition: WNL  Sleep:  Good   Screenings:   Assessment and Plan: This patient is a 49 year old female with a history of depression and anxiety.  She is doing well with her current regimen.  She will continue  Prozac 40 mg daily for depression, Xanax 1 mg 3 times daily as needed for anxiety and Ambien 10 mg at bedtime for sleep.  She will return to see me in 3 months   [name], MD 06/12/2019, 2:19 PM

## 2019-09-26 ENCOUNTER — Encounter (HOSPITAL_COMMUNITY): Payer: Self-pay | Admitting: Psychiatry

## 2019-09-26 ENCOUNTER — Telehealth (INDEPENDENT_AMBULATORY_CARE_PROVIDER_SITE_OTHER): Payer: BLUE CROSS/BLUE SHIELD | Admitting: Psychiatry

## 2019-09-26 ENCOUNTER — Other Ambulatory Visit: Payer: Self-pay

## 2019-09-26 ENCOUNTER — Other Ambulatory Visit (HOSPITAL_COMMUNITY): Payer: Self-pay | Admitting: Psychiatry

## 2019-09-26 DIAGNOSIS — F331 Major depressive disorder, recurrent, moderate: Secondary | ICD-10-CM

## 2019-09-26 MED ORDER — FLUOXETINE HCL 40 MG PO CAPS: 40.0000 mg | ORAL_CAPSULE | Freq: Every day | ORAL | 3 refills | Status: DC

## 2019-09-26 MED ORDER — ALPRAZOLAM 1 MG PO TABS: 1.0000 mg | ORAL_TABLET | Freq: Three times a day (TID) | ORAL | 3 refills | Status: DC | PRN

## 2019-09-26 MED ORDER — ZOLPIDEM TARTRATE 10 MG PO TABS: 10.0000 mg | ORAL_TABLET | Freq: Every evening | ORAL | 3 refills | Status: DC | PRN

## 2019-09-26 NOTE — Progress Notes (Signed)
Virtual Visit via Video Note  I connected with Natalie Walker on 09/26/19 at 11:00 AM EDT by a video enabled telemedicine application and verified that I am speaking with the correct person using two identifiers.   I discussed the limitations of evaluation and management by telemedicine and the availability of in person appointments. The patient expressed understanding and agreed to proceed.    I discussed the assessment and treatment plan with the patient. The patient was provided an opportunity to ask questions and all were answered. The patient agreed with the plan and demonstrated an understanding of the instructions.   The patient was advised to call back or seek an in-person evaluation if the symptoms worsen or if the condition fails to improve as anticipated.  I provided 15 minutes of non-face-to-face time during this encounter.   Diannia Ruder, MD  [facility] MD/PA/NP OP Progress Note  09/26/2019 11:13 AM Natalie Walker  MRN:  [id number]  Chief Complaint:  Chief Complaint    Depression; Anxiety; Follow-up     HPI: This patient is a 49 year old married white female who lives with her husband and 2 children in [place].  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  The patient returns for follow-up after 3 months regarding her depression and anxiety.  She states that she continues to do well.  She is enjoying her job just taking care of 1 person.  Her family is generally doing well and one of her children has returned to in person school.  She states that her mood has been stable and she denies serious depression panic attacks thoughts of self-harm or suicide.  She is sleeping well with the Ambien Visit Diagnosis:    ICD-10-CM   1. Major depressive disorder, recurrent episode, moderate (HCC)  F33.1 ALPRAZolam (XANAX) 1 MG tablet    Past Psychiatric History: Past outpatient treatment in our office past outpatient treatment  Past Medical History:  Past Medical History:  Diagnosis Date  .  Anxiety   . Asthma   . Chest pain    NO CAD by cath 12/12/11, nl LV fxn  . Depression   . Headache(784.0)   . Hypertriglyceridemia   . Obesity   . Polycystic ovarian disease   . Tobacco abuse     Past Surgical History:  Procedure Laterality Date  . CARDIAC CATHETERIZATION    . CESAREAN SECTION    . CHOLECYSTECTOMY    . DILATION AND CURETTAGE OF UTERUS    . ECTOPIC PREGNANCY SURGERY    . LEFT HEART CATHETERIZATION WITH CORONARY ANGIOGRAM N/A 12/12/2011   Procedure: LEFT HEART CATHETERIZATION WITH CORONARY ANGIOGRAM;  Surgeon: Tonny Bollman, MD;  Location: [facility] CATH LAB;  Service: Cardiovascular;  Laterality: N/A;  . TUBAL LIGATION      Family Psychiatric History: see below  Family History:  Family History  Problem Relation Age of Onset  . Depression Son   . ADD / ADHD Son   . Depression Paternal Aunt   . Depression Paternal Uncle     Social History:  Social History   Socioeconomic History  . Marital status: Legally Separated    Spouse name: Not on file  . Number of children: Not on file  . Years of education: Not on file  . Highest education level: Not on file  Occupational History  . Not on file  Tobacco Use  . Smoking status: Current Every Day Smoker    Packs/day: 0.50    Years: 20.00  Pack years: 10.00    Types: Cigarettes  . Smokeless tobacco: Never Used  Substance and Sexual Activity  . Alcohol use: Not Currently    Comment: occasional use  . Drug use: No  . Sexual activity: Yes    Partners: Male    Birth control/protection: Surgical, I.U.D.  Other Topics Concern  . Not on file  Social History Narrative  . Not on file   Social Determinants of Health   Financial Resource Strain:   . Difficulty of Paying Living Expenses:   Food Insecurity:   . Worried About Programme researcher, broadcasting/film/video in the Last Year:   . Barista in the Last Year:   Transportation Needs:   . Freight forwarder (Medical):   Marland Kitchen Lack of Transportation (Non-Medical):    Physical Activity:   . Days of Exercise per Week:   . Minutes of Exercise per Session:   Stress:   . Feeling of Stress :   Social Connections:   . Frequency of Communication with Friends and Family:   . Frequency of Social Gatherings with Friends and Family:   . Attends Religious Services:   . Active Member of Clubs or Organizations:   . Attends Banker Meetings:   Marland Kitchen Marital Status:     Allergies: No Known Allergies  Metabolic Disorder Labs: No results found for: HGBA1C, MPG No results found for: PROLACTIN Lab Results  Component Value Date   CHOL 149 04/08/2012   TRIG 138 04/08/2012   HDL 36 (L) 04/08/2012   CHOLHDL 4.1 04/08/2012   VLDL 28 04/08/2012   LDLCALC 85 04/08/2012   LDLCALC 83 12/13/2011   Lab Results  Component Value Date   TSH 1.716 04/08/2012   TSH 1.518 12/12/2011    Therapeutic Level Labs: No results found for: LITHIUM No results found for: VALPROATE No components found for:  CBMZ  Current Medications: Current Outpatient Medications  Medication Sig Dispense Refill  . ALPRAZolam (XANAX) 1 MG tablet Take 1 tablet (1 mg total) by mouth 3 (three) times daily as needed for anxiety. For anxiety 90 tablet 3  . aspirin-acetaminophen-caffeine (EXCEDRIN MIGRAINE) 250-250-65 MG per tablet Take 1 tablet by mouth every 6 (six) hours as needed for pain.    . benazepril-hydrochlorthiazide (LOTENSIN HCT) 20-25 MG tablet Take 1 tablet by mouth daily.    . cephALEXin (KEFLEX) 500 MG capsule Take 500 mg by mouth 2 (two) times daily.    Marland Kitchen FLUoxetine (PROZAC) 40 MG capsule Take 1 capsule (40 mg total) by mouth daily. 30 capsule 3  . ibuprofen (ADVIL,MOTRIN) 600 MG tablet Take 1 tablet (600 mg total) by mouth every 6 (six) hours as needed. 30 tablet 0  . loratadine (CLARITIN) 10 MG tablet Take 1 tablet (10 mg total) by mouth daily. (Patient not taking: Reported on 07/24/2018) 14 tablet 0  . metFORMIN (GLUCOPHAGE) 1000 MG tablet Take 2,000 mg by mouth at  bedtime.    Marland Kitchen zolpidem (AMBIEN) 10 MG tablet Take 1 tablet (10 mg total) by mouth at bedtime as needed for sleep. 30 tablet 3   No current facility-administered medications for this visit.     Musculoskeletal: Strength & Muscle Tone: within normal limits Gait & Station: normal Patient leans: N/A  Psychiatric Specialty Exam: Review of Systems  All other systems reviewed and are negative.   There were no vitals taken for this visit.There is no height or weight on file to calculate BMI.  General Appearance: Casual and Fairly  Groomed  Eye Contact:  Good  Speech:  Clear and Coherent  Volume:  Normal  Mood:  Euthymic  Affect:  Appropriate and Congruent  Thought Process:  Goal Directed  Orientation:  Full (Time, Place, and Person)  Thought Content: WDL   Suicidal Thoughts:  No  Homicidal Thoughts:  No  Memory:  Immediate;   Good Recent;   Good Remote;   Good  Judgement:  Good  Insight:  Fair  Psychomotor Activity:  Normal  Concentration:  Concentration: Good and Attention Span: Good  Recall:  Good  Fund of Knowledge: Good  Language: Good  Akathisia:  No  Handed:  Right  AIMS (if indicated): not done  Assets:  Communication Skills Desire for Improvement Physical Health Resilience Social Support Talents/Skills  ADL's:  Intact  Cognition: WNL  Sleep:  Good   Screenings:   Assessment and Plan: This patient is a 49 year old female with a history of depression and anxiety.  She continues to do well on her current regimen.  She will continue Prozac 40 mg daily for depression, Xanax 1 mg 3 times daily as needed for anxiety and Ambien 10 mg at bedtime for sleep.  She will return to see me in 4 months   [name], MD 09/26/2019, 11:13 AM

## 2020-01-01 ENCOUNTER — Other Ambulatory Visit: Payer: Self-pay

## 2020-01-01 ENCOUNTER — Encounter (HOSPITAL_COMMUNITY): Payer: Self-pay | Admitting: Psychiatry

## 2020-01-01 ENCOUNTER — Telehealth (INDEPENDENT_AMBULATORY_CARE_PROVIDER_SITE_OTHER): Payer: BLUE CROSS/BLUE SHIELD | Admitting: Psychiatry

## 2020-01-01 DIAGNOSIS — F331 Major depressive disorder, recurrent, moderate: Secondary | ICD-10-CM

## 2020-01-01 MED ORDER — ALPRAZOLAM 1 MG PO TABS: 1.0000 mg | ORAL_TABLET | Freq: Three times a day (TID) | ORAL | 3 refills | Status: DC | PRN

## 2020-01-01 MED ORDER — ZOLPIDEM TARTRATE 10 MG PO TABS: 10.0000 mg | ORAL_TABLET | Freq: Every evening | ORAL | 3 refills | Status: DC | PRN

## 2020-01-01 MED ORDER — FLUOXETINE HCL 40 MG PO CAPS: 40.0000 mg | ORAL_CAPSULE | Freq: Every day | ORAL | 3 refills | Status: DC

## 2020-01-01 NOTE — Progress Notes (Signed)
Virtual Visit via Video Note  I connected with Natalie Walker on 01/01/20 at  1:20 PM EDT by a video enabled telemedicine application and verified that I am speaking with the correct person using two identifiers.   I discussed the limitations of evaluation and management by telemedicine and the availability of in person appointments. The patient expressed understanding and agreed to proceed    I discussed the assessment and treatment plan with the patient. The patient was provided an opportunity to ask questions and all were answered. The patient agreed with the plan and demonstrated an understanding of the instructions.   The patient was advised to call back or seek an in-person evaluation if the symptoms worsen or if the condition fails to improve as anticipated.  I provided 15 minutes of non-face-to-face time during this encounter. Location: Provider office, patient home  Natalie Ruder, MD  [facility] MD/PA/NP OP Progress Note  01/01/2020 1:41 PM [name]  MRN:  [id number]  Chief Complaint:  Chief Complaint    Anxiety; Depression; Follow-up     HPI: This patient is a 49 year old married white female who is her husband and 2 children in [place].  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  The patient returns after 3 months regarding depression and anxiety.  She still enjoys her job just taking care of 1 person.  She states that she has had more anxiety because she worries about the coronavirus pandemic.  However she has not yet gotten vaccinated and I told her this would really ease her worries if she had this level of protection.  She has a lot of concerns about possible side effects but explained the side effects are minimal and the effects of Covid itself are not real and can be devastating.  She states that she will consider more seriously.  She states that her mood is stable and she denies serious depression panic attacks or suicidal thoughts.  She is sleeping well with Ambien. Visit  Diagnosis:    ICD-10-CM   1. Major depressive disorder, recurrent episode, moderate (HCC)  F33.1 ALPRAZolam (XANAX) 1 MG tablet    Past Psychiatric History: Past outpatient treatment in our office  Past Medical History:  Past Medical History:  Diagnosis Date  . Anxiety   . Asthma   . Chest pain    NO CAD by cath 12/12/11, nl LV fxn  . Depression   . Headache(784.0)   . Hypertriglyceridemia   . Obesity   . Polycystic ovarian disease   . Tobacco abuse     Past Surgical History:  Procedure Laterality Date  . CARDIAC CATHETERIZATION    . CESAREAN SECTION    . CHOLECYSTECTOMY    . DILATION AND CURETTAGE OF UTERUS    . ECTOPIC PREGNANCY SURGERY    . LEFT HEART CATHETERIZATION WITH CORONARY ANGIOGRAM N/A 12/12/2011   Procedure: LEFT HEART CATHETERIZATION WITH CORONARY ANGIOGRAM;  Surgeon: Tonny Bollman, MD;  Location: [facility] CATH LAB;  Service: Cardiovascular;  Laterality: N/A;  . TUBAL LIGATION      Family Psychiatric History: see below  Family History:  Family History  Problem Relation Age of Onset  . Depression Son   . ADD / ADHD Son   . Depression Paternal Aunt   . Depression Paternal Uncle     Social History:  Social History   Socioeconomic History  . Marital status: Legally Separated    Spouse name: Not on file  . Number of children: Not on  file  . Years of education: Not on file  . Highest education level: Not on file  Occupational History  . Not on file  Tobacco Use  . Smoking status: Current Every Day Smoker    Packs/day: 0.50    Years: 20.00    Pack years: 10.00    Types: Cigarettes  . Smokeless tobacco: Never Used  Vaping Use  . Vaping Use: Never used  Substance and Sexual Activity  . Alcohol use: Not Currently    Comment: occasional use  . Drug use: No  . Sexual activity: Yes    Partners: Male    Birth control/protection: Surgical, I.U.D.  Other Topics Concern  . Not on file  Social History Narrative  . Not on file   Social Determinants of  Health   Financial Resource Strain:   . Difficulty of Paying Living Expenses: Not on file  Food Insecurity:   . Worried About Programme researcher, broadcasting/film/video in the Last Year: Not on file  . Ran Out of Food in the Last Year: Not on file  Transportation Needs:   . Lack of Transportation (Medical): Not on file  . Lack of Transportation (Non-Medical): Not on file  Physical Activity:   . Days of Exercise per Week: Not on file  . Minutes of Exercise per Session: Not on file  Stress:   . Feeling of Stress : Not on file  Social Connections:   . Frequency of Communication with Friends and Family: Not on file  . Frequency of Social Gatherings with Friends and Family: Not on file  . Attends Religious Services: Not on file  . Active Member of Clubs or Organizations: Not on file  . Attends Banker Meetings: Not on file  . Marital Status: Not on file    Allergies: No Known Allergies  Metabolic Disorder Labs: No results found for: HGBA1C, MPG No results found for: PROLACTIN Lab Results  Component Value Date   CHOL 149 04/08/2012   TRIG 138 04/08/2012   HDL 36 (L) 04/08/2012   CHOLHDL 4.1 04/08/2012   VLDL 28 04/08/2012   LDLCALC 85 04/08/2012   LDLCALC 83 12/13/2011   Lab Results  Component Value Date   TSH 1.716 04/08/2012   TSH 1.518 12/12/2011    Therapeutic Level Labs: No results found for: LITHIUM No results found for: VALPROATE No components found for:  CBMZ  Current Medications: Current Outpatient Medications  Medication Sig Dispense Refill  . ALPRAZolam (XANAX) 1 MG tablet Take 1 tablet (1 mg total) by mouth 3 (three) times daily as needed for anxiety. For anxiety 90 tablet 3  . aspirin-acetaminophen-caffeine (EXCEDRIN MIGRAINE) 250-250-65 MG per tablet Take 1 tablet by mouth every 6 (six) hours as needed for pain.    . benazepril-hydrochlorthiazide (LOTENSIN HCT) 20-25 MG tablet Take 1 tablet by mouth daily.    . cephALEXin (KEFLEX) 500 MG capsule Take 500 mg by  mouth 2 (two) times daily.    Marland Kitchen FLUoxetine (PROZAC) 40 MG capsule Take 1 capsule (40 mg total) by mouth daily. 30 capsule 3  . ibuprofen (ADVIL,MOTRIN) 600 MG tablet Take 1 tablet (600 mg total) by mouth every 6 (six) hours as needed. 30 tablet 0  . loratadine (CLARITIN) 10 MG tablet Take 1 tablet (10 mg total) by mouth daily. (Patient not taking: Reported on 07/24/2018) 14 tablet 0  . metFORMIN (GLUCOPHAGE) 1000 MG tablet Take 2,000 mg by mouth at bedtime.    Marland Kitchen zolpidem (AMBIEN) 10 MG tablet  Take 1 tablet (10 mg total) by mouth at bedtime as needed for sleep. 30 tablet 3   No current facility-administered medications for this visit.     Musculoskeletal: Strength & Muscle Tone: within normal limits Gait & Station: normal Patient leans: N/A  Psychiatric Specialty Exam: Review of Systems  Psychiatric/Behavioral: The patient is nervous/anxious.   All other systems reviewed and are negative.   There were no vitals taken for this visit.There is no height or weight on file to calculate BMI.  General Appearance: Casual and Fairly Groomed  Eye Contact:  Good  Speech:  Clear and Coherent  Volume:  Normal  Mood:  Anxious and Euthymic  Affect:  Appropriate and Congruent  Thought Process:  Goal Directed  Orientation:  Full (Time, Place, and Person)  Thought Content: Rumination   Suicidal Thoughts:  No  Homicidal Thoughts:  No  Memory:  Immediate;   Good Recent;   Good Remote;   Good  Judgement:  Good  Insight:  Good  Psychomotor Activity:  Normal  Concentration:  Concentration: Good and Attention Span: Good  Recall:  Good  Fund of Knowledge: Good  Language: Good  Akathisia:  No  Handed:  Right  AIMS (if indicated): not done  Assets:  Communication Skills Desire for Improvement Physical Health Resilience Social Support Talents/Skills  ADL's:  Intact  Cognition: WNL  Sleep:  Good   Screenings:   Assessment and Plan: This patient is a 49 year old female with a history of  depression and anxiety.  She continues to do well on her current regimen.  She will continue Prozac 40 mg daily for depression, Xanax 1 mg 3 times daily as needed for anxiety and Ambien 10 mg at bedtime for sleep.  She will return to see me in 4 months   Natalie Ruder, MD 01/01/2020, 1:41 PM

## 2020-04-30 ENCOUNTER — Encounter (HOSPITAL_COMMUNITY): Payer: Self-pay | Admitting: Psychiatry

## 2020-04-30 ENCOUNTER — Other Ambulatory Visit: Payer: Self-pay

## 2020-04-30 ENCOUNTER — Telehealth (INDEPENDENT_AMBULATORY_CARE_PROVIDER_SITE_OTHER): Payer: BLUE CROSS/BLUE SHIELD | Admitting: Psychiatry

## 2020-04-30 DIAGNOSIS — F331 Major depressive disorder, recurrent, moderate: Secondary | ICD-10-CM | POA: Diagnosis not present

## 2020-04-30 MED ORDER — FLUOXETINE HCL 40 MG PO CAPS: 40.0000 mg | ORAL_CAPSULE | Freq: Every day | ORAL | 3 refills | Status: DC

## 2020-04-30 MED ORDER — ALPRAZOLAM 1 MG PO TABS: 1.0000 mg | ORAL_TABLET | Freq: Three times a day (TID) | ORAL | 3 refills | Status: DC | PRN

## 2020-04-30 MED ORDER — ZOLPIDEM TARTRATE 10 MG PO TABS: 10.0000 mg | ORAL_TABLET | Freq: Every evening | ORAL | 3 refills | Status: DC | PRN
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
## 2020-07-06 DIAGNOSIS — Z6841 Body Mass Index (BMI) 40.0 and over, adult: Secondary | ICD-10-CM | POA: Diagnosis not present

## 2020-07-06 DIAGNOSIS — Z0000 Encounter for general adult medical examination without abnormal findings: Secondary | ICD-10-CM | POA: Diagnosis not present

## 2020-07-09 DIAGNOSIS — Z0000 Encounter for general adult medical examination without abnormal findings: Secondary | ICD-10-CM | POA: Diagnosis not present

## 2020-07-26 ENCOUNTER — Encounter (HOSPITAL_COMMUNITY): Payer: BLUE CROSS/BLUE SHIELD | Admitting: Psychiatry

## 2020-07-26 ENCOUNTER — Other Ambulatory Visit: Payer: Self-pay

## 2020-07-27 ENCOUNTER — Encounter (HOSPITAL_COMMUNITY): Payer: Self-pay | Admitting: Psychiatry

## 2020-07-27 ENCOUNTER — Telehealth (INDEPENDENT_AMBULATORY_CARE_PROVIDER_SITE_OTHER): Payer: BLUE CROSS/BLUE SHIELD | Admitting: Psychiatry

## 2020-07-27 ENCOUNTER — Other Ambulatory Visit: Payer: Self-pay

## 2020-07-27 DIAGNOSIS — F331 Major depressive disorder, recurrent, moderate: Secondary | ICD-10-CM | POA: Diagnosis not present

## 2020-07-27 MED ORDER — ZOLPIDEM TARTRATE 10 MG PO TABS: 10.0000 mg | ORAL_TABLET | Freq: Every evening | ORAL | 3 refills | Status: DC | PRN

## 2020-07-27 MED ORDER — FLUOXETINE HCL 40 MG PO CAPS: 40.0000 mg | ORAL_CAPSULE | Freq: Every day | ORAL | 3 refills | Status: DC

## 2020-07-27 MED ORDER — ALPRAZOLAM 1 MG PO TABS: 1.0000 mg | ORAL_TABLET | Freq: Three times a day (TID) | ORAL | 3 refills | Status: DC | PRN

## 2020-07-27 NOTE — Progress Notes (Signed)
Virtual Visit via Telephone Note  I connected with Natalie Walker on 07/27/20 at 11:00 AM EDT by telephone and verified that I am speaking with the correct person using two identifiers.  Location: Patient: home Provider: home   I discussed the limitations, risks, security and privacy concerns of performing an evaluation and management service by telephone and the availability of in person appointments. I also discussed with the patient that there may be a patient responsible charge related to this service. The patient expressed understanding and agreed to proceed.    I discussed the assessment and treatment plan with the patient. The patient was provided an opportunity to ask questions and all were answered. The patient agreed with the plan and demonstrated an understanding of the instructions.   The patient was advised to call back or seek an in-person evaluation if the symptoms worsen or if the condition fails to improve as anticipated.  I provided 15 minutes of non-face-to-face time during this encounter.   Diannia Ruder, MD  [facility] MD/PA/NP OP Progress Note  07/27/2020 11:22 AM Natalie Walker  MRN:  [id number]  Chief Complaint: depression, anxiety HPI: This patient is a 50 year old separated white female who is living with her 2 children in [place]. 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  She states that she is doing well.  She is still separated from her husband and states that she is much happier without them.  Her mood has been good and she denies any depressive symptoms or suicidal ideation.  Her anxiety is under good control with the Xanax.  She is sleeping well with the Ambien.  Her primary doctor prescribed Robaxin for mild back pain and she knows not to combine this with the Xanax. Visit Diagnosis:    ICD-10-CM   1. Major depressive disorder, recurrent episode, moderate (HCC)  F33.1 ALPRAZolam  (XANAX) 1 MG tablet    Past Psychiatric History: Past outpatient treatment in our office  Past Medical History:  Past Medical History:  Diagnosis Date  . Anxiety   . Asthma   . Chest pain    NO CAD by cath 12/12/11, nl LV fxn  . Depression   . Headache(784.0)   . Hypertriglyceridemia   . Obesity   . Polycystic ovarian disease   . Tobacco abuse     Past Surgical History:  Procedure Laterality Date  . CARDIAC CATHETERIZATION    . CESAREAN SECTION    . CHOLECYSTECTOMY    . DILATION AND CURETTAGE OF UTERUS    . ECTOPIC PREGNANCY SURGERY    . LEFT HEART CATHETERIZATION WITH CORONARY ANGIOGRAM N/A 12/12/2011   Procedure: LEFT HEART CATHETERIZATION WITH CORONARY ANGIOGRAM;  Surgeon: Tonny Bollman, MD;  Location: [facility] CATH LAB;  Service: Cardiovascular;  Laterality: N/A;  . TUBAL LIGATION      Family Psychiatric History: see below  Family History:  Family History  Problem Relation Age of Onset  . Depression Son   . ADD / ADHD Son   . Depression Paternal Aunt   . Depression Paternal Uncle     Social History:  Social History   Socioeconomic History  . Marital status: Legally Separated    Spouse name: Not on file  . Number of children: Not on file  . Years of education: Not on file  . Highest education level: Not on file  Occupational History  . Not on file  Tobacco  Use  . Smoking status: Current Every Day Smoker    Packs/day: 0.50    Years: 20.00    Pack years: 10.00    Types: Cigarettes  . Smokeless tobacco: Never Used  Vaping Use  . Vaping Use: Never used  Substance and Sexual Activity  . Alcohol use: Not Currently    Comment: occasional use  . Drug use: No  . Sexual activity: Yes    Partners: Male    Birth control/protection: Surgical, I.U.D.  Other Topics Concern  . Not on file  Social History Narrative  . Not on file   Social Determinants of Health   Financial Resource Strain: Not on file  Food Insecurity: Not on file  Transportation Needs: Not on  file  Physical Activity: Not on file  Stress: Not on file  Social Connections: Not on file    Allergies: No Known Allergies  Metabolic Disorder Labs: No results found for: HGBA1C, MPG No results found for: PROLACTIN Lab Results  Component Value Date   CHOL 149 04/08/2012   TRIG 138 04/08/2012   HDL 36 (L) 04/08/2012   CHOLHDL 4.1 04/08/2012   VLDL 28 04/08/2012   LDLCALC 85 04/08/2012   LDLCALC 83 12/13/2011   Lab Results  Component Value Date   TSH 1.716 04/08/2012   TSH 1.518 12/12/2011    Therapeutic Level Labs: No results found for: LITHIUM No results found for: VALPROATE No components found for:  CBMZ  Current Medications: Current Outpatient Medications  Medication Sig Dispense Refill  . ALPRAZolam (XANAX) 1 MG tablet Take 1 tablet (1 mg total) by mouth 3 (three) times daily as needed for anxiety. For anxiety 90 tablet 3  . aspirin-acetaminophen-caffeine (EXCEDRIN MIGRAINE) 250-250-65 MG per tablet Take 1 tablet by mouth every 6 (six) hours as needed for pain.    . benazepril-hydrochlorthiazide (LOTENSIN HCT) 20-25 MG tablet Take 1 tablet by mouth daily.    . cephALEXin (KEFLEX) 500 MG capsule Take 500 mg by mouth 2 (two) times daily.    Marland Kitchen FLUoxetine (PROZAC) 40 MG capsule Take 1 capsule (40 mg total) by mouth daily. 30 capsule 3  . ibuprofen (ADVIL,MOTRIN) 600 MG tablet Take 1 tablet (600 mg total) by mouth every 6 (six) hours as needed. 30 tablet 0  . loratadine (CLARITIN) 10 MG tablet Take 1 tablet (10 mg total) by mouth daily. (Patient not taking: Reported on 07/24/2018) 14 tablet 0  . metFORMIN (GLUCOPHAGE) 1000 MG tablet Take 2,000 mg by mouth at bedtime.    . methocarbamol (ROBAXIN) 500 MG tablet Take 500 mg by mouth at bedtime.    Marland Kitchen zolpidem (AMBIEN) 10 MG tablet Take 1 tablet (10 mg total) by mouth at bedtime as needed for sleep. 30 tablet 3   No current facility-administered medications for this visit.     Musculoskeletal: Strength & Muscle Tone:  within normal limits Gait & Station: normal Patient leans: N/A  Psychiatric Specialty Exam: Review of Systems  Musculoskeletal: Positive for back pain.  All other systems reviewed and are negative.   There were no vitals taken for this visit.There is no height or weight on file to calculate BMI.  General Appearance: NA  Eye Contact:  NA  Speech:  Clear and Coherent  Volume:  Normal  Mood:  Euthymic  Affect:  NA  Thought Process:  Goal Directed  Orientation:  Full (Time, Place, and Person)  Thought Content: WDL   Suicidal Thoughts:  No  Homicidal Thoughts:  No  Memory:  Immediate;   Good Recent;   Good Remote;   Fair  Judgement:  Good  Insight:  Good  Psychomotor Activity:  Normal  Concentration:  Concentration: Good and Attention Span: Good  Recall:  Good  Fund of Knowledge: Good  Language: Good  Akathisia:  No  Handed:  Right  AIMS (if indicated): not done  Assets:  Communication Skills Desire for Improvement Physical Health Resilience Social Support Talents/Skills  ADL's:  Intact  Cognition: WNL  Sleep:  Good   Screenings: PHQ2-9   Flowsheet Row Video [facility]27[facility]2020 [facility]  PHQ-2 Total Score 0    Flowsheet Row Video Visit from 07/27/2020 in [facility]  C-SSRS RISK CATEGORY No Risk       Assessment and Plan: This patient is a 50 year old female with a history of depression and anxiety.  She continues to do well on her current regimen.  She will continue Prozac 40 mg daily for depression, Xanax 1 mg 3 times daily as needed for anxiety and Ambien 10 mg at bedtime for sleep.  She will return to see me in 3 months   Diannia Ruder, MD 07/27/2020, 11:22 AM

## 2020-07-29 ENCOUNTER — Telehealth (HOSPITAL_COMMUNITY): Payer: BLUE CROSS/BLUE SHIELD | Admitting: Psychiatry

## 2020-09-26 ENCOUNTER — Other Ambulatory Visit (HOSPITAL_COMMUNITY): Payer: Self-pay | Admitting: Psychiatry

## 2020-09-26 DIAGNOSIS — F331 Major depressive disorder, recurrent, moderate: Secondary | ICD-10-CM

## 2020-11-23 DIAGNOSIS — Z20822 Contact with and (suspected) exposure to covid-19: Secondary | ICD-10-CM | POA: Diagnosis not present

## 2020-12-10 ENCOUNTER — Other Ambulatory Visit: Payer: Self-pay

## 2020-12-10 ENCOUNTER — Telehealth (INDEPENDENT_AMBULATORY_CARE_PROVIDER_SITE_OTHER): Payer: BLUE CROSS/BLUE SHIELD | Admitting: Psychiatry

## 2020-12-10 ENCOUNTER — Encounter (HOSPITAL_COMMUNITY): Payer: Self-pay | Admitting: Psychiatry

## 2020-12-10 DIAGNOSIS — F331 Major depressive disorder, recurrent, moderate: Secondary | ICD-10-CM | POA: Diagnosis not present

## 2020-12-10 MED ORDER — ALPRAZOLAM 1 MG PO TABS: 1.0000 mg | ORAL_TABLET | Freq: Three times a day (TID) | ORAL | 3 refills | Status: DC | PRN

## 2020-12-10 MED ORDER — ZOLPIDEM TARTRATE 10 MG PO TABS: 10.0000 mg | ORAL_TABLET | Freq: Every evening | ORAL | 3 refills | Status: DC | PRN

## 2020-12-10 MED ORDER — FLUOXETINE HCL 40 MG PO CAPS: 40.0000 mg | ORAL_CAPSULE | Freq: Every day | ORAL | 3 refills | Status: DC
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
## 2021-01-25 NOTE — Progress Notes (Signed)
This encounter was created in error - please disregard.

## 2021-04-22 ENCOUNTER — Other Ambulatory Visit: Payer: Self-pay

## 2021-04-22 ENCOUNTER — Telehealth (INDEPENDENT_AMBULATORY_CARE_PROVIDER_SITE_OTHER): Payer: Medicaid - Out of State | Admitting: Psychiatry

## 2021-04-22 ENCOUNTER — Encounter (HOSPITAL_COMMUNITY): Payer: Self-pay | Admitting: Psychiatry

## 2021-04-22 DIAGNOSIS — F331 Major depressive disorder, recurrent, moderate: Secondary | ICD-10-CM | POA: Diagnosis not present

## 2021-04-22 MED ORDER — FLUOXETINE HCL 40 MG PO CAPS: 40.0000 mg | ORAL_CAPSULE | Freq: Every day | ORAL | 3 refills | Status: DC

## 2021-04-22 MED ORDER — ZOLPIDEM TARTRATE 10 MG PO TABS: 10.0000 mg | ORAL_TABLET | Freq: Every evening | ORAL | 3 refills | Status: DC | PRN

## 2021-04-22 MED ORDER — ALPRAZOLAM 1 MG PO TABS: 1.0000 mg | ORAL_TABLET | Freq: Three times a day (TID) | ORAL | 3 refills | Status: DC | PRN

## 2021-04-22 NOTE — Progress Notes (Signed)
Virtual Visit via Telephone Note  I connected with Natalie Walker on 04/22/21 at 10:40 AM EST by telephone and verified that I am speaking with the correct person using two identifiers.  Location: Patient: home Provider: home office   I discussed the limitations, risks, security and privacy concerns of performing an evaluation and management service by telephone and the availability of in person appointments. I also discussed with the patient that there may be a patient responsible charge related to this service. The patient expressed understanding and agreed to proceed.     I discussed the assessment and treatment plan with the patient. The patient was provided an opportunity to ask questions and all were answered. The patient agreed with the plan and demonstrated an understanding of the instructions.   The patient was advised to call back or seek an in-person evaluation if the symptoms worsen or if the condition fails to improve as anticipated.  I provided 14 minutes of non-face-to-face time during this encounter.   Diannia Ruder, MD  [facility] MD/PA/NP OP Progress Note  04/22/2021 10:53 AM Natalie Walker  MRN:  [id number]  Chief Complaint: depression, anxiety HPI: This patient is a 50 year old separated white female who is living with her 2 children in [place]. 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  She states that she continues to generally do well.  She was dealing with a breast abscess over the last couple months is asked to see a Careers adviser.  She is also dealing with her 43 year old son who most become quite distraught and anxious after living with his father.  He was recently placed on Prozac.  She states he is starting to do a little bit better.  In terms of her self she denies significant depression anxiety or difficulty sleeping.  Her mood has been good and she denies suicidal ideation Visit Diagnosis:    ICD-10-CM   1. Major depressive  disorder, recurrent episode, moderate (HCC)  F33.1 ALPRAZolam (XANAX) 1 MG tablet      Past Psychiatric History: Past outpatient treatment in our office  Past Medical History:  Past Medical History:  Diagnosis Date   Anxiety    Asthma    Chest pain    NO CAD by cath 12/12/11, nl LV fxn   Depression    Headache(784.0)    Hypertriglyceridemia    Obesity    Polycystic ovarian disease    Tobacco abuse     Past Surgical History:  Procedure Laterality Date   CARDIAC CATHETERIZATION     CESAREAN SECTION     CHOLECYSTECTOMY     DILATION AND CURETTAGE OF UTERUS     ECTOPIC PREGNANCY SURGERY     LEFT HEART CATHETERIZATION WITH CORONARY ANGIOGRAM N/A 12/12/2011   Procedure: LEFT HEART CATHETERIZATION WITH CORONARY ANGIOGRAM;  Surgeon: Tonny Bollman, MD;  Location: [facility] CATH LAB;  Service: Cardiovascular;  Laterality: N/A;   TUBAL LIGATION      Family Psychiatric History: see below  Family History:  Family History  Problem Relation Age of Onset   Depression Son    ADD / ADHD Son    Depression Paternal Aunt    Depression Paternal Uncle     Social History:  Social History   Socioeconomic History   Marital status: Legally Separated    Spouse name: Not on file   Number of children: Not on file   Years of education: Not on file   Highest  education level: Not on file  Occupational History   Not on file  Tobacco Use   Smoking status: Every Day    Packs/day: 0.50    Years: 20.00    Pack years: 10.00    Types: Cigarettes   Smokeless tobacco: Never  Vaping Use   Vaping Use: Never used  Substance and Sexual Activity   Alcohol use: Not Currently    Comment: occasional use   Drug use: No   Sexual activity: Yes    Partners: Male    Birth control/protection: Surgical, I.U.D.  Other Topics Concern   Not on file  Social History Narrative   Not on file   Social Determinants of Health   Financial Resource Strain: Not on file  Food Insecurity: Not on file  Transportation  Needs: Not on file  Physical Activity: Not on file  Stress: Not on file  Social Connections: Not on file    Allergies: No Known Allergies  Metabolic Disorder Labs: No results found for: HGBA1C, MPG No results found for: PROLACTIN Lab Results  Component Value Date   CHOL 149 04/08/2012   TRIG 138 04/08/2012   HDL 36 (L) 04/08/2012   CHOLHDL 4.1 04/08/2012   VLDL 28 04/08/2012   LDLCALC 85 04/08/2012   LDLCALC 83 12/13/2011   Lab Results  Component Value Date   TSH 1.716 04/08/2012   TSH 1.518 12/12/2011    Therapeutic Level Labs: No results found for: LITHIUM No results found for: VALPROATE No components found for:  CBMZ  Current Medications: Current Outpatient Medications  Medication Sig Dispense Refill   ALPRAZolam (XANAX) 1 MG tablet Take 1 tablet (1 mg total) by mouth 3 (three) times daily as needed. for anxiety 90 tablet 3   aspirin-acetaminophen-caffeine (EXCEDRIN MIGRAINE) 250-250-65 MG per tablet Take 1 tablet by mouth every 6 (six) hours as needed for pain.     benazepril-hydrochlorthiazide (LOTENSIN HCT) 20-25 MG tablet Take 1 tablet by mouth daily.     cephALEXin (KEFLEX) 500 MG capsule Take 500 mg by mouth 2 (two) times daily.     FLUoxetine (PROZAC) 40 MG capsule Take 1 capsule (40 mg total) by mouth daily. 30 capsule 3   ibuprofen (ADVIL,MOTRIN) 600 MG tablet Take 1 tablet (600 mg total) by mouth every 6 (six) hours as needed. 30 tablet 0   loratadine (CLARITIN) 10 MG tablet Take 1 tablet (10 mg total) by mouth daily. (Patient not taking: Reported on 07/24/2018) 14 tablet 0   metFORMIN (GLUCOPHAGE) 1000 MG tablet Take 2,000 mg by mouth at bedtime.     methocarbamol (ROBAXIN) 500 MG tablet Take 500 mg by mouth at bedtime.     zolpidem (AMBIEN) 10 MG tablet Take 1 tablet (10 mg total) by mouth at bedtime as needed. for sleep 30 tablet 3   No current facility-administered medications for this visit.     Musculoskeletal: Strength & Muscle Tone: na Gait &  Station: na Patient leans: N/A  Psychiatric Specialty Exam: Review of Systems  All other systems reviewed and are negative.  There were no vitals taken for this visit.There is no height or weight on file to calculate BMI.  General Appearance: NA  Eye Contact:  NA  Speech:  Clear and Coherent  Volume:  Normal  Mood:  Euthymic  Affect:  NA  Thought Process:  Goal Directed  Orientation:  Full (Time, Place, and Person)  Thought Content: WDL   Suicidal Thoughts:  No  Homicidal Thoughts:  No  Memory:  Immediate;   Good Recent;   Good Remote;   Good  Judgement:  Good  Insight:  Good  Psychomotor Activity:  Normal  Concentration:  Concentration: Good and Attention Span: Good  Recall:  Good  Fund of Knowledge: Good  Language: Good  Akathisia:  No  Handed:  Right  AIMS (if indicated): not done  Assets:  Communication Skills Desire for Improvement Physical Health Resilience Social Support  ADL's:  Intact  Cognition: WNL  Sleep:  Good   Screenings: PHQ2-9    Flowsheet Row [facility] Video Visit from 12/10/2020 in [facility] Video Visit from 07/27/2020 in [facility]  PHQ-2 Total Score 0 0 0      Flowsheet Row Video Visit from 04/22/2021 in [facility] Video Visit from 12/10/2020 in [facility] Video Visit from 07/27/2020 in [facility]  C-SSRS RISK CATEGORY No Risk No Risk No Risk        Assessment and Plan: This patient is a 50 year old female with a history of depression and anxiety.  She continues to do very well on her current regimen.  She will continue Prozac 40 mg daily for depression, Xanax 1 mg 3 times daily as needed for anxiety and Ambien 10 mg at bedtime for sleep.  She will return  to see me in 4 months  Diannia Ruder, MD 04/22/2021, 10:53 AM

## 2021-08-09 ENCOUNTER — Other Ambulatory Visit (HOSPITAL_COMMUNITY): Payer: Self-pay | Admitting: Psychiatry

## 2021-08-09 DIAGNOSIS — F331 Major depressive disorder, recurrent, moderate: Secondary | ICD-10-CM

## 2021-08-09 NOTE — Telephone Encounter (Signed)
Call for appt

## 2021-08-19 ENCOUNTER — Encounter (HOSPITAL_COMMUNITY): Payer: Self-pay | Admitting: Psychiatry

## 2021-08-19 ENCOUNTER — Telehealth (INDEPENDENT_AMBULATORY_CARE_PROVIDER_SITE_OTHER): Payer: Medicaid - Out of State | Admitting: Psychiatry

## 2021-08-19 DIAGNOSIS — F331 Major depressive disorder, recurrent, moderate: Secondary | ICD-10-CM

## 2021-08-19 MED ORDER — FLUOXETINE HCL 40 MG PO CAPS: 40.0000 mg | ORAL_CAPSULE | Freq: Every day | ORAL | 3 refills | Status: DC

## 2021-08-19 MED ORDER — ALPRAZOLAM 1 MG PO TABS: 1.0000 mg | ORAL_TABLET | Freq: Three times a day (TID) | ORAL | 3 refills | Status: DC | PRN

## 2021-08-19 MED ORDER — ZOLPIDEM TARTRATE 10 MG PO TABS: 10.0000 mg | ORAL_TABLET | Freq: Every evening | ORAL | 3 refills | Status: DC | PRN

## 2021-08-19 NOTE — Progress Notes (Signed)
Virtual Visit via Telephone Note ? ?I connected with Natalie Walker on 08/19/21 at 10:40 AM EDT by telephone and verified that I am speaking with the correct person using two identifiers. ? ?Location: ?Patient: home ?Provider: home office ?  ?I discussed the limitations, risks, security and privacy concerns of performing an evaluation and management service by telephone and the availability of in person appointments. I also discussed with the patient that there may be a patient responsible charge related to this service. The patient expressed understanding and agreed to proceed. ? ? ? ?  ?I discussed the assessment and treatment plan with the patient. The patient was provided an opportunity to ask questions and all were answered. The patient agreed with the plan and demonstrated an understanding of the instructions. ?  ?The patient was advised to call back or seek an in-person evaluation if the symptoms worsen or if the condition fails to improve as anticipated. ? ?I provided 15 minutes of non-face-to-face time during this encounter. ? ? ?Diannia Ruder, MD ? ?[facility] MD/PA/NP OP Progress Note ? ?08/19/2021 10:59 AM ?Natalie Walker  ?MRN:  [id number] ? ?Chief Complaint:  ?Chief Complaint  ?Patient presents with  ? Anxiety  ? Depression  ? Follow-up  ? ?HPI: This patient is a 51 year old separated white female who is living with her 2 children in [place]. 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 ?The patient returns for follow-up after 4 months.  She states that nothing much is changed.  She states that she is doing well.  She denies significant depression anxiety or insomnia.  She is enjoying her job.  Both her children stay with her and are doing home school and she states they are both doing well.  She denies any new physical complaints.  She thinks her current regimen is working well for her. ?Visit Diagnosis:  ?  ICD-10-CM   ?1. Major depressive disorder, recurrent episode, moderate (HCC)  F33.1 ALPRAZolam (XANAX) 1 MG tablet   ?  ? ? ?Past Psychiatric History: Past outpatient treatment in our office ? ?Past Medical History:  ?Past Medical History:  ?Diagnosis Date  ? Anxiety   ? Asthma   ? Chest pain   ? NO CAD by cath 12/12/11, nl LV fxn  ? Depression   ? Headache(784.0)   ? Hypertriglyceridemia   ? Obesity   ? Polycystic ovarian disease   ? Tobacco abuse   ?  ?Past Surgical History:  ?Procedure Laterality Date  ? CARDIAC CATHETERIZATION    ? CESAREAN SECTION    ? CHOLECYSTECTOMY    ? DILATION AND CURETTAGE OF UTERUS    ? ECTOPIC PREGNANCY SURGERY    ? LEFT HEART CATHETERIZATION WITH CORONARY ANGIOGRAM N/A 12/12/2011  ? Procedure: LEFT HEART CATHETERIZATION WITH CORONARY ANGIOGRAM;  Surgeon: Tonny Bollman, MD;  Location: [facility] CATH LAB;  Service: Cardiovascular;  Laterality: N/A;  ? TUBAL LIGATION    ? ? ?Family Psychiatric History: See below ? ?Family History:  ?Family History  ?Problem Relation Age of Onset  ? Depression Son   ? ADD / ADHD Son   ? Depression Paternal Aunt   ? Depression Paternal Uncle   ? ? ?Social History:  ?Social History  ? ?Socioeconomic History  ? Marital status: Legally Separated  ?  Spouse name: Not on file  ? Number of children: Not on file  ? Years of education: Not on file  ? Highest education level: Not on file  ?Occupational History  ?  Not on file  ?Tobacco Use  ? Smoking status: Every Day  ?  Packs/day: 0.50  ?  Years: 20.00  ?  Pack years: 10.00  ?  Types: Cigarettes  ? Smokeless tobacco: Never  ?Vaping Use  ? Vaping Use: Never used  ?Substance and Sexual Activity  ? Alcohol use: Not Currently  ?  Comment: occasional use  ? Drug use: No  ? Sexual activity: Yes  ?  Partners: Male  ?  Birth control/protection: Surgical, I.U.D.  ?Other Topics Concern  ? Not on file  ?Social History Narrative  ? Not on file  ? ?Social Determinants of Health  ? ?Financial Resource Strain: Not on file  ?Food Insecurity: Not on file  ?Transportation Needs: Not on file  ?Physical Activity: Not on file  ?Stress: Not on file  ?Social  Connections: Not on file  ? ? ?Allergies: No Known Allergies ? ?Metabolic Disorder Labs: ?No results found for: HGBA1C, MPG ?No results found for: PROLACTIN ?Lab Results  ?Component Value Date  ? CHOL 149 04/08/2012  ? TRIG 138 04/08/2012  ? HDL 36 (L) 04/08/2012  ? CHOLHDL 4.1 04/08/2012  ? VLDL 28 04/08/2012  ? LDLCALC 85 04/08/2012  ? LDLCALC 83 12/13/2011  ? ?Lab Results  ?Component Value Date  ? TSH 1.716 04/08/2012  ? TSH 1.518 12/12/2011  ? ? ?Therapeutic Level Labs: ?No results found for: LITHIUM ?No results found for: VALPROATE ?No components found for:  CBMZ ? ?Current Medications: ?Current Outpatient Medications  ?Medication Sig Dispense Refill  ? ALPRAZolam (XANAX) 1 MG tablet Take 1 tablet (1 mg total) by mouth 3 (three) times daily as needed. for anxiety 90 tablet 3  ? aspirin-acetaminophen-caffeine (EXCEDRIN MIGRAINE) 250-250-65 MG per tablet Take 1 tablet by mouth every 6 (six) hours as needed for pain.    ? benazepril-hydrochlorthiazide (LOTENSIN HCT) 20-25 MG tablet Take 1 tablet by mouth daily.    ? cephALEXin (KEFLEX) 500 MG capsule Take 500 mg by mouth 2 (two) times daily.    ? FLUoxetine (PROZAC) 40 MG capsule Take 1 capsule (40 mg total) by mouth daily. 30 capsule 3  ? ibuprofen (ADVIL,MOTRIN) 600 MG tablet Take 1 tablet (600 mg total) by mouth every 6 (six) hours as needed. 30 tablet 0  ? loratadine (CLARITIN) 10 MG tablet Take 1 tablet (10 mg total) by mouth daily. (Patient not taking: Reported on 07/24/2018) 14 tablet 0  ? metFORMIN (GLUCOPHAGE) 1000 MG tablet Take 2,000 mg by mouth at bedtime.    ? methocarbamol (ROBAXIN) 500 MG tablet Take 500 mg by mouth at bedtime.    ? zolpidem (AMBIEN) 10 MG tablet Take 1 tablet (10 mg total) by mouth at bedtime as needed. for sleep 30 tablet 3  ? ?No current facility-administered medications for this visit.  ? ? ? ?Musculoskeletal: ?Strength & Muscle Tone: na ?Gait & Station: na ?Patient leans: N/A ? ?Psychiatric Specialty Exam: ?Review of Systems   ?All other systems reviewed and are negative.  ?There were no vitals taken for this visit.There is no height or weight on file to calculate BMI.  ?General Appearance: NA  ?Eye Contact:  NA  ?Speech:  Clear and Coherent  ?Volume:  Normal  ?Mood:  Euthymic  ?Affect:  NA  ?Thought Process:  Goal Directed  ?Orientation:  Full (Time, Place, and Person)  ?Thought Content: WDL   ?Suicidal Thoughts:  No  ?Homicidal Thoughts:  No  ?Memory:  Immediate;   Good ?Recent;   Good ?Remote;  Good  ?Judgement:  Good  ?Insight:  Good  ?Psychomotor Activity:  Normal  ?Concentration:  Concentration: Good and Attention Span: Good  ?Recall:  Good  ?Fund of Knowledge: Good  ?Language: Good  ?Akathisia:  No  ?Handed:  Right  ?AIMS (if indicated): not done  ?Assets:  Communication Skills ?Desire for Improvement ?Physical Health ?Resilience ?Social Support ?Talents/Skills  ?ADL's:  Intact  ?Cognition: WNL  ?Sleep:  Good  ? ?Screenings: ?PHQ2-9   ? ?Flowsheet Row [facility]19[facility]2021 [facility] Video Visit from 04/22/2021 in [facility] Video Visit from 12/10/2020 in [facility] Video Visit from 07[facility]27/2020 in [facility]  ?PHQ-2 Total Score 0 0 0 0  ? ?  ? ?Flowsheet Row Video Visit from 08/19/2021 in [facility] Video Visit from 04/22/2021 in [facility] Video Visit from 12/10/2020 [facility]  ?C-SSRS RISK CATEGORY No Risk No Risk No Risk  ? ?  ? ? ? ?Assessment and Plan: This patient is a 51 year old female with a history of depression and anxiety.  She is doing very well on her current regimen.  She will continue Prozac 40 mg daily for depression, Xanax 1 mg 3 times daily as needed for anxiety and Ambien 10 mg at bedtime for sleep.  She  will return to see me in 4 months ? ?Collaboration of Care: Collaboration of Care: Primary Care Provider AEB chart notes will be made available to PCP at patient's request ? ?Patient/Guardian was advise

## 2021-08-22 ENCOUNTER — Telehealth (HOSPITAL_COMMUNITY): Payer: Medicaid - Out of State | Admitting: Psychiatry

## 2021-11-17 ENCOUNTER — Telehealth (INDEPENDENT_AMBULATORY_CARE_PROVIDER_SITE_OTHER): Payer: Medicaid - Out of State | Admitting: Psychiatry

## 2021-11-17 ENCOUNTER — Encounter (HOSPITAL_COMMUNITY): Payer: Self-pay | Admitting: Psychiatry

## 2021-11-17 DIAGNOSIS — F331 Major depressive disorder, recurrent, moderate: Secondary | ICD-10-CM

## 2021-11-17 MED ORDER — FLUOXETINE HCL 20 MG PO CAPS: 20.0000 mg | ORAL_CAPSULE | Freq: Every day | ORAL | 2 refills | Status: DC

## 2021-11-17 MED ORDER — FLUOXETINE HCL 40 MG PO CAPS: 40.0000 mg | ORAL_CAPSULE | Freq: Every day | ORAL | 3 refills | Status: DC

## 2021-11-17 MED ORDER — ALPRAZOLAM 1 MG PO TABS: 1.0000 mg | ORAL_TABLET | Freq: Three times a day (TID) | ORAL | 3 refills | Status: DC | PRN

## 2021-11-17 MED ORDER — ZOLPIDEM TARTRATE 10 MG PO TABS: 10.0000 mg | ORAL_TABLET | Freq: Every evening | ORAL | 3 refills | Status: DC | PRN

## 2021-11-17 NOTE — Progress Notes (Signed)
Virtual Visit via Telephone Note  I connected with Natalie Walker on 11/17/21 at  3:20 PM EDT by telephone and verified that I am speaking with the correct person using two identifiers.  Location: Patient: home Provider: office   I discussed the limitations, risks, security and privacy concerns of performing an evaluation and management service by telephone and the availability of in person appointments. I also discussed with the patient that there may be a patient responsible charge related to this service. The patient expressed understanding and agreed to proceed.      I discussed the assessment and treatment plan with the patient. The patient was provided an opportunity to ask questions and all were answered. The patient agreed with the plan and demonstrated an understanding of the instructions.   The patient was advised to call back or seek an in-person evaluation if the symptoms worsen or if the condition fails to improve as anticipated.  I provided 20 minutes of non-face-to-face time during this encounter.   Diannia Ruder, MD  [facility] MD/PA/NP OP Progress Note  11/17/2021 4:10 PM Natalie Walker  MRN:  [id number]  Chief Complaint:  Chief Complaint  Patient presents with   Depression   Anxiety   Follow-up   HPI: This patient is a 51 year old separated white female who is living with her 2 children in [place].  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  The patient returns for follow-up after 3 months.  She states that she is becoming more depressed since the summer started.  She has no motivation or energy.  She is doing everything she has to do like her schoolwork her job and taking care of the kids.  She has no motivation to do much of anything else.  She feels sad all the time.  She denies thoughts of self-harm or suicide.  She does have polycystic ovarian syndrome and takes metformin for insulin resistance.  She just saw her OB today and nothing was changed.  She feels like the Prozac  is no longer working so I suggested before we use augmentation agents that we increase the dosage and she agrees.   Visit Diagnosis:    ICD-10-CM   1. Major depressive disorder, recurrent episode, moderate (HCC)  F33.1 ALPRAZolam (XANAX) 1 MG tablet      Past Psychiatric History: Past outpatient treatment in our office  Past Medical History:  Past Medical History:  Diagnosis Date   Anxiety    Asthma    Chest pain    NO CAD by cath 12/12/11, nl LV fxn   Depression    Headache(784.0)    Hypertriglyceridemia    Obesity    Polycystic ovarian disease    Tobacco abuse     Past Surgical History:  Procedure Laterality Date   CARDIAC CATHETERIZATION     CESAREAN SECTION     CHOLECYSTECTOMY     DILATION AND CURETTAGE OF UTERUS     ECTOPIC PREGNANCY SURGERY     LEFT HEART CATHETERIZATION WITH CORONARY ANGIOGRAM N/A 12/12/2011   Procedure: LEFT HEART CATHETERIZATION WITH CORONARY ANGIOGRAM;  Surgeon: Tonny Bollman, MD;  Location: [facility] CATH LAB;  Service: Cardiovascular;  Laterality: N/A;   TUBAL LIGATION      Family Psychiatric History: See below  Family History:  Family History  Problem Relation Age of Onset   Depression Son    ADD / ADHD Son    Depression Paternal Aunt    Depression Paternal Uncle  Social History:  Social History   Socioeconomic History   Marital status: Legally Separated    Spouse name: Not on file   Number of children: Not on file   Years of education: Not on file   Highest education level: Not on file  Occupational History   Not on file  Tobacco Use   Smoking status: Every Day    Packs/day: 0.50    Years: 20.00    Total pack years: 10.00    Types: Cigarettes   Smokeless tobacco: Never  Vaping Use   Vaping Use: Never used  Substance and Sexual Activity   Alcohol use: Not Currently    Comment: occasional use   Drug use: No   Sexual activity: Yes    Partners: Male    Birth control/protection: Surgical, I.U.D.  Other Topics Concern   Not  on file  Social History Narrative   Not on file   Social Determinants of Health   Financial Resource Strain: Not on file  Food Insecurity: Not on file  Transportation Needs: Not on file  Physical Activity: Not on file  Stress: Not on file  Social Connections: Not on file    Allergies: No Known Allergies  Metabolic Disorder Labs: No results found for: "HGBA1C", "MPG" No results found for: "PROLACTIN" Lab Results  Component Value Date   CHOL 149 04/08/2012   TRIG 138 04/08/2012   HDL 36 (L) 04/08/2012   CHOLHDL 4.1 04/08/2012   VLDL 28 04/08/2012   LDLCALC 85 04/08/2012   LDLCALC 83 12/13/2011   Lab Results  Component Value Date   TSH 1.716 04/08/2012   TSH 1.518 12/12/2011    Therapeutic Level Labs: No results found for: "LITHIUM" No results found for: "VALPROATE" No results found for: "CBMZ"  Current Medications: Current Outpatient Medications  Medication Sig Dispense Refill   FLUoxetine (PROZAC) 20 MG capsule Take 1 capsule (20 mg total) by mouth daily. 30 capsule 2   ALPRAZolam (XANAX) 1 MG tablet Take 1 tablet (1 mg total) by mouth 3 (three) times daily as needed. for anxiety 90 tablet 3   aspirin-acetaminophen-caffeine (EXCEDRIN MIGRAINE) 250-250-65 MG per tablet Take 1 tablet by mouth every 6 (six) hours as needed for pain.     benazepril-hydrochlorthiazide (LOTENSIN HCT) 20-25 MG tablet Take 1 tablet by mouth daily.     cephALEXin (KEFLEX) 500 MG capsule Take 500 mg by mouth 2 (two) times daily.     FLUoxetine (PROZAC) 40 MG capsule Take 1 capsule (40 mg total) by mouth daily. 30 capsule 3   ibuprofen (ADVIL,MOTRIN) 600 MG tablet Take 1 tablet (600 mg total) by mouth every 6 (six) hours as needed. 30 tablet 0   loratadine (CLARITIN) 10 MG tablet Take 1 tablet (10 mg total) by mouth daily. (Patient not taking: Reported on 07/24/2018) 14 tablet 0   metFORMIN (GLUCOPHAGE) 1000 MG tablet Take 2,000 mg by mouth at bedtime.     methocarbamol (ROBAXIN) 500 MG tablet  Take 500 mg by mouth at bedtime.     zolpidem (AMBIEN) 10 MG tablet Take 1 tablet (10 mg total) by mouth at bedtime as needed. for sleep 30 tablet 3   No current facility-administered medications for this visit.     Musculoskeletal: Strength & Muscle Tone: na Gait & Station: na Patient leans: N/A  Psychiatric Specialty Exam: Review of Systems  Psychiatric/Behavioral:  Positive for dysphoric mood.   All other systems reviewed and are negative.   There were no vitals taken for  this visit.There is no height or weight on file to calculate BMI.  General Appearance: NA  Eye Contact:  NA  Speech:  Clear and Coherent  Volume:  Normal  Mood: Depressed  Affect:  NA  Thought Process:  Goal Directed  Orientation:  Full (Time, Place, and Person)  Thought Content: Rumination   Suicidal Thoughts:  No  Homicidal Thoughts:  No  Memory:  Immediate;   Good Recent;   Good Remote;   Good  Judgement:  Good  Insight:  Good  Psychomotor Activity:  Normal  Concentration:  Concentration: Good and Attention Span: Good  Recall:  Good  Fund of Knowledge: Good  Language: Good  Akathisia:  No  Handed:  Right  AIMS (if indicated): not done  Assets:  Communication Skills Desire for Improvement Physical Health Resilience Social Support Talents/Skills  ADL's:  Intact  Cognition: WNL  Sleep:  Good   Screenings: PHQ2-9    Flowsheet Row [facility] Video Visit from 08/19/2021 in [facility] Video Visit from 04/22/2021 in [facility] Video Visit from 12/10/2020 in [facility] Video Visit from 07/27/2020 in [facility]  PHQ-2 Total Score 6 0 0 0 0  PHQ-9 Total Score 6 -- -- -- --      Flowsheet Row [facility] Video Visit from 04/22/2021 [facility] Video Visit from 12/10/2020 [facility]  C-SSRS RISK CATEGORY No Risk No Risk No Risk        Assessment and Plan: This patient is a 51 year old female with a history of depression and anxiety.  She is has become more depressed recently and cannot think of any precipitants.  We will increase Prozac to 60 mg daily for depression.  For now she will continue Xanax 1 mg 3 times daily as needed for anxiety and Ambien 10 mg at bedtime for sleep.  She will return to see me in 4 weeks  Collaboration of Care: Collaboration of Care: Primary Care Provider AEB notes will be made available to PCP at patient's request  Patient/Guardian was advised Release of Information must be obtained prior to any record release in order to collaborate their care with an outside provider. Patient/Guardian was advised if they have not already done so to contact the registration department to sign all necessary forms in order for [place] to release information regarding their care.   Consent: Patient/Guardian gives verbal consent for treatment and assignment of benefits for services provided during this visit. Patient/Guardian expressed understanding and agreed to proceed.    Diannia Ruder, MD 11/17/2021, 4:10 PM

## 2021-12-08 ENCOUNTER — Telehealth (INDEPENDENT_AMBULATORY_CARE_PROVIDER_SITE_OTHER): Payer: Medicaid - Out of State | Admitting: Psychiatry

## 2021-12-08 ENCOUNTER — Encounter (HOSPITAL_COMMUNITY): Payer: Self-pay | Admitting: Psychiatry

## 2021-12-08 DIAGNOSIS — F331 Major depressive disorder, recurrent, moderate: Secondary | ICD-10-CM | POA: Diagnosis not present

## 2021-12-08 NOTE — Progress Notes (Signed)
Virtual Visit via Telephone Note  I connected with Natalie Walker on 12/08/21 at  1:20 PM EDT by telephone and verified that I am speaking with the correct person using two identifiers.  Location: Patient: home Provider: office   I discussed the limitations, risks, security and privacy concerns of performing an evaluation and management service by telephone and the availability of in person appointments. I also discussed with the patient that there may be a patient responsible charge related to this service. The patient expressed understanding and agreed to proceed.      I discussed the assessment and treatment plan with the patient. The patient was provided an opportunity to ask questions and all were answered. The patient agreed with the plan and demonstrated an understanding of the instructions.   The patient was advised to call back or seek an in-person evaluation if the symptoms worsen or if the condition fails to improve as anticipated.  I provided 12 minutes of non-face-to-face time during this encounter.   Diannia Ruder, MD  [facility] MD/PA/NP OP Progress Note  12/08/2021 1:35 PM Natalie Walker  MRN:  [id number]  Chief Complaint:  Chief Complaint  Patient presents with   Depression   Anxiety   Follow-up   HPI: This patient is a 51 year old separated white female who is living with her 2 children in [place].  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  The patient returns for follow-up after 3 weeks.  Last time she was getting increasingly depressed and we increased her Prozac from 40 to 60 mg.  She has not seen much change until the last week.  Now she is feeling considerably better.  She is less depressed and anxious, no longer having panic attacks.  She is also taking magnesium at night and she is sleeping well.  Her mood is much better and she denies thoughts of self-harm or suicidal ideation.  She seems to be getting more done as well.  She would like to keep the medications as is and  I think this is a good idea. Visit Diagnosis:    ICD-10-CM   1. Major depressive disorder, recurrent episode, moderate (HCC)  F33.1       Past Psychiatric History: Past outpatient treatment in our office  Past Medical History:  Past Medical History:  Diagnosis Date   Anxiety    Asthma    Chest pain    NO CAD by cath 12/12/11, nl LV fxn   Depression    Headache(784.0)    Hypertriglyceridemia    Obesity    Polycystic ovarian disease    Tobacco abuse     Past Surgical History:  Procedure Laterality Date   CARDIAC CATHETERIZATION     CESAREAN SECTION     CHOLECYSTECTOMY     DILATION AND CURETTAGE OF UTERUS     ECTOPIC PREGNANCY SURGERY     LEFT HEART CATHETERIZATION WITH CORONARY ANGIOGRAM N/A 12/12/2011   Procedure: LEFT HEART CATHETERIZATION WITH CORONARY ANGIOGRAM;  Surgeon: Tonny Bollman, MD;  Location: [facility] CATH LAB;  Service: Cardiovascular;  Laterality: N/A;   TUBAL LIGATION      Family Psychiatric History: See below  Family History:  Family History  Problem Relation Age of Onset   Depression Son    ADD / ADHD Son    Depression Paternal Aunt    Depression Paternal Uncle     Social History:  Social History   Socioeconomic History   Marital status: Legally Separated  Spouse name: Not on file   Number of children: Not on file   Years of education: Not on file   Highest education level: Not on file  Occupational History   Not on file  Tobacco Use   Smoking status: Every Day    Packs/day: 0.50    Years: 20.00    Total pack years: 10.00    Types: Cigarettes   Smokeless tobacco: Never  Vaping Use   Vaping Use: Never used  Substance and Sexual Activity   Alcohol use: Not Currently    Comment: occasional use   Drug use: No   Sexual activity: Yes    Partners: Male    Birth control/protection: Surgical, I.U.D.  Other Topics Concern   Not on file  Social History Narrative   Not on file   Social Determinants of Health   Financial Resource Strain:  Not on file  Food Insecurity: Not on file  Transportation Needs: Not on file  Physical Activity: Not on file  Stress: Not on file  Social Connections: Not on file    Allergies: No Known Allergies  Metabolic Disorder Labs: No results found for: "HGBA1C", "MPG" No results found for: "PROLACTIN" Lab Results  Component Value Date   CHOL 149 04/08/2012   TRIG 138 04/08/2012   HDL 36 (L) 04/08/2012   CHOLHDL 4.1 04/08/2012   VLDL 28 04/08/2012   LDLCALC 85 04/08/2012   LDLCALC 83 12/13/2011   Lab Results  Component Value Date   TSH 1.716 04/08/2012   TSH 1.518 12/12/2011    Therapeutic Level Labs: No results found for: "LITHIUM" No results found for: "VALPROATE" No results found for: "CBMZ"  Current Medications: Current Outpatient Medications  Medication Sig Dispense Refill   ALPRAZolam (XANAX) 1 MG tablet Take 1 tablet (1 mg total) by mouth 3 (three) times daily as needed. for anxiety 90 tablet 3   aspirin-acetaminophen-caffeine (EXCEDRIN MIGRAINE) 250-250-65 MG per tablet Take 1 tablet by mouth every 6 (six) hours as needed for pain.     benazepril-hydrochlorthiazide (LOTENSIN HCT) 20-25 MG tablet Take 1 tablet by mouth daily.     cephALEXin (KEFLEX) 500 MG capsule Take 500 mg by mouth 2 (two) times daily.     FLUoxetine (PROZAC) 20 MG capsule Take 1 capsule (20 mg total) by mouth daily. 30 capsule 2   FLUoxetine (PROZAC) 40 MG capsule Take 1 capsule (40 mg total) by mouth daily. 30 capsule 3   ibuprofen (ADVIL,MOTRIN) 600 MG tablet Take 1 tablet (600 mg total) by mouth every 6 (six) hours as needed. 30 tablet 0   loratadine (CLARITIN) 10 MG tablet Take 1 tablet (10 mg total) by mouth daily. (Patient not taking: Reported on 07/24/2018) 14 tablet 0   metFORMIN (GLUCOPHAGE) 1000 MG tablet Take 2,000 mg by mouth at bedtime.     methocarbamol (ROBAXIN) 500 MG tablet Take 500 mg by mouth at bedtime.     zolpidem (AMBIEN) 10 MG tablet Take 1 tablet (10 mg total) by mouth at  bedtime as needed. for sleep 30 tablet 3   No current facility-administered medications for this visit.     Musculoskeletal: Strength & Muscle Tone: na Gait & Station: na Patient leans: na  Psychiatric Specialty Exam: Review of Systems  All other systems reviewed and are negative.   There were no vitals taken for this visit.There is no height or weight on file to calculate BMI.  General Appearance: NA  Eye Contact:  NA  Speech:  Clear and  Coherent  Volume:  Normal  Mood:  Euthymic  Affect:  NA  Thought Process:  Goal Directed  Orientation:  Full (Time, Place, and Person)  Thought Content: WDL   Suicidal Thoughts:  No  Homicidal Thoughts:  No  Memory:  Immediate;   Good Recent;   Good Remote;   Fair  Judgement:  Good  Insight:  Good  Psychomotor Activity:  Normal  Concentration:  Concentration: Good and Attention Span: Good  Recall:  Good  Fund of Knowledge: Good  Language: Good  Akathisia:  No  Handed:  Right  AIMS (if indicated): not done  Assets:  Communication Skills Desire for Improvement Physical Health Resilience Social Support Talents/Skills  ADL's:  Intact  Cognition: WNL  Sleep:  Good   Screenings: PHQ2-9    Flowsheet Row Video [facility] from [facility]2021 [facility] Video Visit from 11/17/2021 in [facility] Video Visit from 08/19/2021 in [facility] Video Visit from 04/22/2021 in [facility] Video Visit from 12/10/2020 in [facility]  PHQ-2 Total Score 0 6 0 0 0  PHQ-9 Total Score -- 6 -- -- --      Flowsheet Row Video Visit from 08/19/2021 in [facility] Video Visit from 04/22/2021 in [facility] Video Visit from 12/10/2020 [facility]  C-SSRS RISK CATEGORY No Risk No Risk No Risk        Assessment and Plan: This patient is a 51 year old female with a history of depression and anxiety.  Last time she was getting much more depressed but she seems to be responding well to Prozac 60 mg daily so this will be continued.  She will also continue Xanax 1 mg 3 times daily as needed for anxiety and Ambien 10 mg at bedtime for sleep.  She will return to see me in 6 weeks  Collaboration of Care: Collaboration of Care: Primary Care Provider AEB notes will be shared with PCP at patient's request  Patient/Guardian was advised Release of Information must be obtained prior to any record release in order to collaborate their care with an outside provider. Patient/Guardian was advised if they have not already done so to contact the registration department to sign all necessary forms in order for [place] to release information regarding their care.   Consent: Patient/Guardian gives verbal consent for treatment and assignment of benefits for services provided during this visit. Patient/Guardian expressed understanding and agreed to proceed.    Diannia Ruder, MD 12/08/2021, 1:35 PM

## 2022-02-15 ENCOUNTER — Telehealth (INDEPENDENT_AMBULATORY_CARE_PROVIDER_SITE_OTHER): Payer: Medicaid - Out of State | Admitting: Psychiatry

## 2022-02-15 ENCOUNTER — Encounter (HOSPITAL_COMMUNITY): Payer: Self-pay | Admitting: Psychiatry

## 2022-02-15 DIAGNOSIS — F331 Major depressive disorder, recurrent, moderate: Secondary | ICD-10-CM | POA: Diagnosis not present

## 2022-02-15 MED ORDER — FLUOXETINE HCL 40 MG PO CAPS: 40.0000 mg | ORAL_CAPSULE | Freq: Every day | ORAL | 3 refills | Status: DC

## 2022-02-15 MED ORDER — FLUOXETINE HCL 20 MG PO CAPS: 20.0000 mg | ORAL_CAPSULE | Freq: Every day | ORAL | 3 refills | Status: DC

## 2022-02-15 MED ORDER — ALPRAZOLAM 1 MG PO TABS: 1.0000 mg | ORAL_TABLET | Freq: Three times a day (TID) | ORAL | 3 refills | Status: DC | PRN

## 2022-02-15 MED ORDER — ZOLPIDEM TARTRATE 10 MG PO TABS: 10.0000 mg | ORAL_TABLET | Freq: Every evening | ORAL | 3 refills | Status: DC | PRN

## 2022-02-15 NOTE — Progress Notes (Signed)
Virtual Visit via Telephone Note  I connected with Natalie Walker on 02/15/22 at 11:20 AM EDT by telephone and verified that I am speaking with the correct person using two identifiers.  Location: Patient: home Provider: office   I discussed the limitations, risks, security and privacy concerns of performing an evaluation and management service by telephone and the availability of in person appointments. I also discussed with the patient that there may be a patient responsible charge related to this service. The patient expressed understanding and agreed to proceed.      I discussed the assessment and treatment plan with the patient. The patient was provided an opportunity to ask questions and all were answered. The patient agreed with the plan and demonstrated an understanding of the instructions.   The patient was advised to call back or seek an in-person evaluation if the symptoms worsen or if the condition fails to improve as anticipated.  I provided 15 minutes of non-face-to-face time during this encounter.   Natalie Spiller, MD  [facility] MD/PA/NP OP Progress Note  02/15/2022 11:30 AM Natalie Walker  MRN:  [id number]  Chief Complaint:  Chief Complaint  Patient presents with   Depression   Anxiety   Follow-up   HPI: This patient is a 51 year old separated white female who is living with her 2 children in [place].  She works as a Quarry manager for [employer].  The patient returns for follow-up after 2 months.  A few months ago we increased her Prozac from 40 to 60 mg.  She states that she has been feeling considerably better.  Her mood has been good and she denies any depression thoughts of self-harm or suicide.  She is less anxious and is not having panic attacks as long as she takes the Xanax.  She is sleeping well.  She is studying psychology online and is enjoying her classes. Visit Diagnosis:    ICD-10-CM   1. Major depressive disorder, recurrent episode, moderate (HCC)  F33.1 ALPRAZolam  (XANAX) 1 MG tablet      Past Psychiatric History: Past outpatient treatment in our office  Past Medical History:  Past Medical History:  Diagnosis Date   Anxiety    Asthma    Chest pain    NO CAD by cath 12/12/11, nl LV fxn   Depression    Headache(784.0)    Hypertriglyceridemia    Obesity    Polycystic ovarian disease    Tobacco abuse     Past Surgical History:  Procedure Laterality Date   CARDIAC CATHETERIZATION     CESAREAN SECTION     CHOLECYSTECTOMY     DILATION AND CURETTAGE OF UTERUS     ECTOPIC PREGNANCY SURGERY     LEFT HEART CATHETERIZATION WITH CORONARY ANGIOGRAM N/A 12/12/2011   Procedure: LEFT HEART CATHETERIZATION WITH CORONARY ANGIOGRAM;  Surgeon: Sherren Mocha, MD;  Location: [facility] LAB;  Service: Cardiovascular;  Laterality: N/A;   TUBAL LIGATION      Family Psychiatric History: See below  Family History:  Family History  Problem Relation Age of Onset   Depression Son    ADD / ADHD Son    Depression Paternal Aunt    Depression Paternal Uncle     Social History:  Social History   Socioeconomic History   Marital status: Legally Separated    Spouse name: Not on file   Number of children: Not on file   Years of education: Not on file   Highest education level: Not  on file  Occupational History   Not on file  Tobacco Use   Smoking status: Every Day    Packs/day: 0.50    Years: 20.00    Total pack years: 10.00    Types: Cigarettes   Smokeless tobacco: Never  Vaping Use   Vaping Use: Never used  Substance and Sexual Activity   Alcohol use: Not Currently    Comment: occasional use   Drug use: No   Sexual activity: Yes    Partners: Male    Birth control/protection: Surgical, I.U.D.  Other Topics Concern   Not on file  Social History Narrative   Not on file   Social Determinants of Health   Financial Resource Strain: Not on file  Food Insecurity: Not on file  Transportation Needs: Not on file  Physical Activity: Not on file   Stress: Not on file  Social Connections: Not on file    Allergies: No Known Allergies  Metabolic Disorder Labs: No results found for: "HGBA1C", "MPG" No results found for: "PROLACTIN" Lab Results  Component Value Date   CHOL 149 04/08/2012   TRIG 138 04/08/2012   HDL 36 (L) 04/08/2012   CHOLHDL 4.1 04/08/2012   VLDL 28 04/08/2012   LDLCALC 85 04/08/2012   LDLCALC 83 12/13/2011   Lab Results  Component Value Date   TSH 1.716 04/08/2012   TSH 1.518 12/12/2011    Therapeutic Level Labs: No results found for: "LITHIUM" No results found for: "VALPROATE" No results found for: "CBMZ"  Current Medications: Current Outpatient Medications  Medication Sig Dispense Refill   ALPRAZolam (XANAX) 1 MG tablet Take 1 tablet (1 mg total) by mouth 3 (three) times daily as needed. for anxiety 90 tablet 3   aspirin-acetaminophen-caffeine (EXCEDRIN MIGRAINE) 250-250-65 MG per tablet Take 1 tablet by mouth every 6 (six) hours as needed for pain.     cephALEXin (KEFLEX) 500 MG capsule Take 500 mg by mouth 2 (two) times daily.     FLUoxetine (PROZAC) 20 MG capsule Take 1 capsule (20 mg total) by mouth daily. 30 capsule 3   FLUoxetine (PROZAC) 40 MG capsule Take 1 capsule (40 mg total) by mouth daily. 30 capsule 3   ibuprofen (ADVIL,MOTRIN) 600 MG tablet Take 1 tablet (600 mg total) by mouth every 6 (six) hours as needed. 30 tablet 0   loratadine (CLARITIN) 10 MG tablet Take 1 tablet (10 mg total) by mouth daily. (Patient not taking: Reported on 07/24/2018) 14 tablet 0   metFORMIN (GLUCOPHAGE) 1000 MG tablet Take 2,000 mg by mouth at bedtime.     methocarbamol (ROBAXIN) 500 MG tablet Take 500 mg by mouth at bedtime.     zolpidem (AMBIEN) 10 MG tablet Take 1 tablet (10 mg total) by mouth at bedtime as needed. for sleep 30 tablet 3   No current facility-administered medications for this visit.     Musculoskeletal: Strength & Muscle Tone: na Gait & Station: na Patient leans: N/A  Psychiatric  Specialty Exam: Review of Systems  All other systems reviewed and are negative.   There were no vitals taken for this visit.There is no height or weight on file to calculate BMI.  General Appearance: NA  Eye Contact:  NA  Speech:  Clear and Coherent  Volume:  Normal  Mood:  Euthymic  Affect:  NA  Thought Process:  Goal Directed  Orientation:  Full (Time, Place, and Person)  Thought Content: WDL   Suicidal Thoughts:  No  Homicidal Thoughts:  No  Memory:  Immediate;   Good Recent;   Good Remote;   Good  Judgement:  Good  Insight:  Good  Psychomotor Activity:  Normal  Concentration:  Concentration: Good and Attention Span: Good  Recall:  Good  Fund of Knowledge: Good  Language: Good  Akathisia:  No  Handed:  Right  AIMS (if indicated): not done  Assets:  Communication Skills Desire for Improvement Physical Health Resilience Social Support Talents/Skills  ADL's:  Intact  Cognition: WNL  Sleep:  Good   Screenings: PHQ2-9    Flowsheet Row [facility] Video Visit from 12/08/2021 in [facility] Video Visit from 11/17/2021 in [facility] Video Visit from 08[facility]19/2021 in [facility] Video Visit from 04/22/2021 in [facility]  PHQ-2 Total Score 0 0 6 0 0  PHQ-9 Total Score -- -- 6 -- --      Flowsheet Row Video Visit from 02/15/2022 in [facility] Video Visit from 08/19/2021 in [facility] Video Visit from 04/22/2021 in [facility]  C-SSRS RISK CATEGORY No Risk No Risk No Risk        Assessment and Plan: This patient is a 51 year old female with a history of depression and anxiety.  She is doing well on her current  regimen.  She will continue Prozac 60 mg daily for depression, Xanax 1 mg 3 times daily for anxiety and Ambien 10 mg at bedtime for sleep.  She will return to see me in  4 months  Collaboration of Care: Collaboration of Care: Primary Care Provider AEB notes will be shared with PCP at patient request  Patient/Guardian was advised Release of Information must be obtained prior to any record release in order to collaborate their care with an outside provider. Patient/Guardian was advised if they have not already done so to contact the registration department to sign all necessary forms in order for [place] to release information regarding their care.   Consent: Patient/Guardian gives verbal consent for treatment and assignment of benefits for services provided during this visit. Patient/Guardian expressed understanding and agreed to proceed.    Diannia Ruder, MD 02/15/2022, 11:30 AM

## 2022-03-26 ENCOUNTER — Other Ambulatory Visit (HOSPITAL_COMMUNITY): Payer: Self-pay | Admitting: Psychiatry

## 2022-03-26 DIAGNOSIS — F331 Major depressive disorder, recurrent, moderate: Secondary | ICD-10-CM

## 2022-06-19 ENCOUNTER — Telehealth (INDEPENDENT_AMBULATORY_CARE_PROVIDER_SITE_OTHER): Payer: Medicaid - Out of State | Admitting: Psychiatry

## 2022-06-19 ENCOUNTER — Encounter (HOSPITAL_COMMUNITY): Payer: Self-pay | Admitting: Psychiatry

## 2022-06-19 DIAGNOSIS — F331 Major depressive disorder, recurrent, moderate: Secondary | ICD-10-CM | POA: Diagnosis not present

## 2022-06-19 DIAGNOSIS — F419 Anxiety disorder, unspecified: Secondary | ICD-10-CM

## 2022-06-19 MED ORDER — ALPRAZOLAM 1 MG PO TABS: 1.0000 mg | ORAL_TABLET | Freq: Three times a day (TID) | ORAL | 3 refills | Status: DC | PRN

## 2022-06-19 MED ORDER — FLUOXETINE HCL 40 MG PO CAPS: 40.0000 mg | ORAL_CAPSULE | Freq: Every day | ORAL | 3 refills | Status: DC

## 2022-06-19 MED ORDER — ZOLPIDEM TARTRATE 10 MG PO TABS: 10.0000 mg | ORAL_TABLET | Freq: Every evening | ORAL | 3 refills | Status: DC | PRN

## 2022-06-19 MED ORDER — FLUOXETINE HCL 20 MG PO CAPS: 20.0000 mg | ORAL_CAPSULE | Freq: Every day | ORAL | 3 refills | Status: DC

## 2022-06-19 NOTE — Progress Notes (Signed)
Virtual Visit via Video Note  I connected with Natalie TULLO on 06/19/22 at  2:40 PM EST by a video enabled telemedicine application and verified that I am speaking with the correct person using two identifiers.  Location: Patient: home Provider: office   I discussed the limitations of evaluation and management by telemedicine and the availability of in person appointments. The patient expressed understanding and agreed to proceed.    I discussed the assessment and treatment plan with the patient. The patient was provided an opportunity to ask questions and all were answered. The patient agreed with the plan and demonstrated an understanding of the instructions.   The patient was advised to call back or seek an in-person evaluation if the symptoms worsen or if the condition fails to improve as anticipated.  I provided 15 minutes of non-face-to-face time during this encounter.   [name], MD  [facility] MD/PA/NP OP Progress Note  06/19/2022 2:57 PM Natalie Walker  MRN:  [id number]  Chief Complaint:  Chief Complaint  Patient presents with   Depression   Anxiety   Follow-up   HPI:  This patient is a 52 year old separated white female who is living with her 2 children in [place].  She works as a Quarry manager for [employer].   Patient returns for follow-up after 4 months.  She seems to be doing well.  She is still working full-time as well as taking classes in psychology.  She states that her mood has been good and she denies significant depression anxiety or thoughts of self-harm.  She is no longer having panic attacks.  She is sleeping well.  She denies any thoughts of suicide. Visit Diagnosis:    ICD-10-CM   1. Anxiety  F41.9     2. Major depressive disorder, recurrent episode, moderate (HCC)  F33.1 ALPRAZolam (XANAX) 1 MG tablet      Past Psychiatric History: Past outpatient treatment in our office  Past Medical History:  Past Medical History:  Diagnosis Date   Anxiety    Asthma     Chest pain    NO CAD by cath 12/12/11, nl LV fxn   Depression    Headache(784.0)    Hypertriglyceridemia    Obesity    Polycystic ovarian disease    Tobacco abuse     Past Surgical History:  Procedure Laterality Date   CARDIAC CATHETERIZATION     CESAREAN SECTION     CHOLECYSTECTOMY     DILATION AND CURETTAGE OF UTERUS     ECTOPIC PREGNANCY SURGERY     LEFT HEART CATHETERIZATION WITH CORONARY ANGIOGRAM N/A 12/12/2011   Procedure: LEFT HEART CATHETERIZATION WITH CORONARY ANGIOGRAM;  Surgeon: Sherren Mocha, MD;  Location[facility] C[facility];  Service: Cardiovascular;  Laterality: N/A;   TUBAL LIGATION      Family Psychiatric History: See below  Family History:  Family History  Problem Relation Age of Onset   Depression Son    ADD / ADHD Son    Depression Paternal Aunt    Depression Paternal Uncle     Social History:  Social History   Socioeconomic History   Marital status: Legally Separated    Spouse name: Not on file   Number of children: Not on file   Years of education: Not on file   Highest education level: Not on file  Occupational History   Not on file  Tobacco Use   Smoking status: Every Day    Packs/day: 0.50    Years: 20.00  Total pack years: 10.00    Types: Cigarettes   Smokeless tobacco: Never  Vaping Use   Vaping Use: Never used  Substance and Sexual Activity   Alcohol use: Not Currently    Comment: occasional use   Drug use: No   Sexual activity: Yes    Partners: Male    Birth control/protection: Surgical, I.U.D.  Other Topics Concern   Not on file  Social History Narrative   Not on file   Social Determinants of Health   Financial Resource Strain: Not on file  Food Insecurity: Not on file  Transportation Needs: Not on file  Physical Activity: Not on file  Stress: Not on file  Social Connections: Not on file    Allergies: No Known Allergies  Metabolic Disorder Labs: No results found for: "HGBA1C", "MPG" No results found for:  "PROLACTIN" Lab Results  Component Value Date   CHOL 149 04/08/2012   TRIG 138 04/08/2012   HDL 36 (L) 04/08/2012   CHOLHDL 4.1 04/08/2012   VLDL 28 04/08/2012   LDLCALC 85 04/08/2012   LDLCALC 83 12/13/2011   Lab Results  Component Value Date   TSH 1.716 04/08/2012   TSH 1.518 12/12/2011    Therapeutic Level Labs: No results found for: "LITHIUM" No results found for: "VALPROATE" No results found for: "CBMZ"  Current Medications: Current Outpatient Medications  Medication Sig Dispense Refill   ALPRAZolam (XANAX) 1 MG tablet Take 1 tablet (1 mg total) by mouth 3 (three) times daily as needed. for anxiety 90 tablet 3   aspirin-acetaminophen-caffeine (EXCEDRIN MIGRAINE) 762-831-51 MG per tablet Take 1 tablet by mouth every 6 (six) hours as needed for pain.     cephALEXin (KEFLEX) 500 MG capsule Take 500 mg by mouth 2 (two) times daily.     FLUoxetine (PROZAC) 20 MG capsule Take 1 capsule (20 mg total) by mouth daily. 30 capsule 3   FLUoxetine (PROZAC) 40 MG capsule Take 1 capsule (40 mg total) by mouth daily. 30 capsule 3   ibuprofen (ADVIL,MOTRIN) 600 MG tablet Take 1 tablet (600 mg total) by mouth every 6 (six) hours as needed. 30 tablet 0   loratadine (CLARITIN) 10 MG tablet Take 1 tablet (10 mg total) by mouth daily. (Patient not taking: Reported on 07/24/2018) 14 tablet 0   metFORMIN (GLUCOPHAGE) 1000 MG tablet Take 2,000 mg by mouth at bedtime.     methocarbamol (ROBAXIN) 500 MG tablet Take 500 mg by mouth at bedtime.     zolpidem (AMBIEN) 10 MG tablet Take 1 tablet (10 mg total) by mouth at bedtime as needed. for sleep 30 tablet 3   No current facility-administered medications for this visit.     Musculoskeletal: Strength & Muscle Tone: within normal limits Gait & Station: normal Patient leans: N/A  Psychiatric Specialty Exam: Review of Systems  All other systems reviewed and are negative.   There were no vitals taken for this visit.There is no height or weight on  file to calculate BMI.  General Appearance: Casual and Fairly Groomed  Eye Contact:  Good  Speech:  Clear and Coherent  Volume:  Normal  Mood:  Euthymic  Affect:  Congruent  Thought Process:  Goal Directed  Orientation:  Full (Time, Place, and Person)  Thought Content: WDL   Suicidal Thoughts:  No  Homicidal Thoughts:  No  Memory:  Immediate;   Good Recent;   Good Remote;   Good  Judgement:  Good  Insight:  Good  Psychomotor Activity:  Normal  Concentration:  Concentration: Good and Attention Span: Good  Recall:  Good  Fund of Knowledge: Good  Language: Good  Akathisia:  No  Handed:  Right  AIMS (if indicated): not done  Assets:  Communication Skills Desire for Improvement Physical Health Resilience Social Support Talents/Skills  ADL's:  Intact  Cognition: WNL  Sleep:  Good   Screenings: PHQ2-9    Flowsheet Row Video Visit from 02/15/2022 in [place] at [place] Video Visit from 12/08/2021 in [place] at [place] Video Visit from 11/17/2021 in [place] at [place] Video Visit from 08/19/2021 in [place] at [place] Video Visit from 04/22/2021 in [place] at [facility] Total Score 0 0 6 0 0  PHQ-9 Total Score -- -- 6 -- --      Flowsheet Row Video Visit from 02/15/2022 in [place] at [place] Video Visit from 08/19/2021 in [place] at [place] Video Visit from 04/22/2021 in [place] at [place] No Risk No Risk No Risk        Assessment and Plan: This patient is a 52 year old female with a history of depression and anxiety.  She continues to do well on her current regimen.  She will continue Prozac 60 mg daily for depression, Xanax 1 mg 3 times daily for anxiety and Ambien 10 mg at  bedtime for sleep.  She will return to see me in 4 months  Collaboration of Care: Collaboration of Care: Primary Care Provider AEB notes will be shared with PCP at patient's request  Patient/Guardian was advised Release of Information must be obtained prior to any record release in order to collaborate their care with an outside provider. Patient/Guardian was advised if they have not already done so to contact the registration department to sign all necessary forms in order for [place] to release information regarding their care.   Consent: Patient/Guardian gives verbal consent for treatment and assignment of benefits for services provided during this visit. Patient/Guardian expressed understanding and agreed to proceed.    [name], MD 06/19/2022, 2:57 PM

## 2022-07-30 ENCOUNTER — Other Ambulatory Visit (HOSPITAL_COMMUNITY): Payer: Self-pay | Admitting: Psychiatry

## 2022-07-30 DIAGNOSIS — F331 Major depressive disorder, recurrent, moderate: Secondary | ICD-10-CM

## 2022-10-31 ENCOUNTER — Telehealth (INDEPENDENT_AMBULATORY_CARE_PROVIDER_SITE_OTHER): Payer: Medicaid - Out of State | Admitting: Psychiatry

## 2022-10-31 ENCOUNTER — Encounter (HOSPITAL_COMMUNITY): Payer: Self-pay | Admitting: Psychiatry

## 2022-10-31 DIAGNOSIS — F331 Major depressive disorder, recurrent, moderate: Secondary | ICD-10-CM

## 2022-10-31 MED ORDER — ALPRAZOLAM 1 MG PO TABS: 1.0000 mg | ORAL_TABLET | Freq: Three times a day (TID) | ORAL | 3 refills | Status: DC | PRN

## 2022-10-31 MED ORDER — ZOLPIDEM TARTRATE 10 MG PO TABS: 10.0000 mg | ORAL_TABLET | Freq: Every evening | ORAL | 3 refills | Status: DC | PRN

## 2022-10-31 MED ORDER — FLUOXETINE HCL 20 MG PO CAPS: 20.0000 mg | ORAL_CAPSULE | Freq: Every day | ORAL | 3 refills | Status: DC

## 2022-10-31 MED ORDER — FLUOXETINE HCL 40 MG PO CAPS: 40.0000 mg | ORAL_CAPSULE | Freq: Every day | ORAL | 3 refills | Status: DC

## 2022-10-31 NOTE — Progress Notes (Signed)
BH MD/PA/NP OP Progress Note  10/31/2022 3:26 PM Natalie Walker  MRN:  [id number]  Chief Complaint:  Chief Complaint  Patient presents with   Depression   Anxiety   Follow-up   HPI: This patient is a 52 year old separated white female who is living with her 2 children in [place].  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   The patient returns for follow-up after 4 months regarding her depression and anxiety and insomnia.  For the most part she is doing okay.  However she felt overwhelmed with having to do both work and school and decided to take a 5-week break from her courses.  She is going to try to enjoy the summer.  She states that she was having more anxiety and she wanted to take care of it before it went too far.  The Xanax still helps but sometimes she waits too long to take it.  She denies significant depression or thoughts of self-harm.  She is sleeping well. Visit Diagnosis:    ICD-10-CM   1. Major depressive disorder, recurrent episode, moderate (HCC)  F33.1 ALPRAZolam (XANAX) 1 MG tablet      Past Psychiatric History: Past outpatient treatment in our office  Past Medical History:  Past Medical History:  Diagnosis Date   Anxiety    Asthma    Chest pain    NO CAD by cath 12/12/11, nl LV fxn   Depression    Headache(784.0)    Hypertriglyceridemia    Obesity    Polycystic ovarian disease    Tobacco abuse     Past Surgical History:  Procedure Laterality Date   CARDIAC CATHETERIZATION     CESAREAN SECTION     CHOLECYSTECTOMY     DILATION AND CURETTAGE OF UTERUS     ECTOPIC PREGNANCY SURGERY     LEFT HEART CATHETERIZATION WITH CORONARY ANGIOGRAM N/A 12/12/2011   Procedure: LEFT HEART CATHETERIZATION WITH CORONARY ANGIOGRAM;  Surgeon: Tonny Bollman, MD;  Location: [facility];  Service: Cardiovascular;  Laterality: N/A;   TUBAL LIGATION      Family Psychiatric History: See below  Family History:  Family History  Problem Relation Age of Onset   Depression Son     ADD / ADHD Son    Depression Paternal Aunt    Depression Paternal Uncle     Social History:  Social History   Socioeconomic History   Marital status: Legally Separated    Spouse name: Not on file   Number of children: Not on file   Years of education: Not on file   Highest education level: Not on file  Occupational History   Not on file  Tobacco Use   Smoking status: Every Day    Packs/day: 0.50    Years: 20.00    Additional pack years: 0.00    Total pack years: 10.00    Types: Cigarettes   Smokeless tobacco: Never  Vaping Use   Vaping Use: Never used  Substance and Sexual Activity   Alcohol use: Not Currently    Comment: occasional use   Drug use: No   Sexual activity: Yes    Partners: Male    Birth control/protection: Surgical, I.U.D.  Other Topics Concern   Not on file  Social History Narrative   Not on file   Social Determinants of Health   Financial Resource Strain: Not on file  Food Insecurity: Not on file  Transportation Needs: Not on file  Physical Activity: Not on file  Stress: Not on file  Social Connections: Not on file    Allergies: No Known Allergies  Metabolic Disorder Labs: No results found for: "HGBA1C", "MPG" No results found for: "PROLACTIN" Lab Results  Component Value Date   CHOL 149 04/08/2012   TRIG 138 04/08/2012   HDL 36 (L) 04/08/2012   CHOLHDL 4.1 04/08/2012   VLDL 28 04/08/2012   LDLCALC 85 04/08/2012   LDLCALC 83 12/13/2011   Lab Results  Component Value Date   TSH 1.716 04/08/2012   TSH 1.518 12/12/2011    Therapeutic Level Labs: No results found for: "LITHIUM" No results found for: "VALPROATE" No results found for: "CBMZ"  Current Medications: Current Outpatient Medications  Medication Sig Dispense Refill   TRULICITY 0.75 MG/0.5ML SOPN Inject into the skin.     ALPRAZolam (XANAX) 1 MG tablet Take 1 tablet (1 mg total) by mouth 3 (three) times daily as needed. for anxiety 90 tablet 3    aspirin-acetaminophen-caffeine (EXCEDRIN MIGRAINE) 250-250-65 MG per tablet Take 1 tablet by mouth every 6 (six) hours as needed for pain.     cephALEXin (KEFLEX) 500 MG capsule Take 500 mg by mouth 2 (two) times daily.     FLUoxetine (PROZAC) 20 MG capsule Take 1 capsule (20 mg total) by mouth daily. 30 capsule 3   FLUoxetine (PROZAC) 40 MG capsule Take 1 capsule (40 mg total) by mouth daily. 30 capsule 3   ibuprofen (ADVIL,MOTRIN) 600 MG tablet Take 1 tablet (600 mg total) by mouth every 6 (six) hours as needed. 30 tablet 0   loratadine (CLARITIN) 10 MG tablet Take 1 tablet (10 mg total) by mouth daily. (Patient not taking: Reported on 07/24/2018) 14 tablet 0   metFORMIN (GLUCOPHAGE) 1000 MG tablet Take 2,000 mg by mouth at bedtime.     methocarbamol (ROBAXIN) 500 MG tablet Take 500 mg by mouth at bedtime.     zolpidem (AMBIEN) 10 MG tablet Take 1 tablet (10 mg total) by mouth at bedtime as needed. for sleep 30 tablet 3   No current facility-administered medications for this visit.     Musculoskeletal: Strength & Muscle Tone: within normal limits Gait & Station: normal Patient leans: N/A  Psychiatric Specialty Exam: Review of Systems  All other systems reviewed and are negative.   There were no vitals taken for this visit.There is no height or weight on file to calculate BMI.  General Appearance: Casual and Fairly Groomed  Eye Contact:  Good  Speech:  Clear and Coherent  Volume:  Normal  Mood:  Anxious and Euthymic  Affect:  Congruent  Thought Process:  Goal Directed  Orientation:  Full (Time, Place, and Person)  Thought Content: WDL   Suicidal Thoughts:  No  Homicidal Thoughts:  No  Memory:  Immediate;   Good Recent;   Good Remote;   Good  Judgement:  Good  Insight:  Good  Psychomotor Activity:  Normal  Concentration:  Concentration: Good and Attention Span: Good  Recall:  Good  Fund of Knowledge: Good  Language: Good  Akathisia:  No  Handed:  Right  AIMS (if  indicated): not done  Assets:  Communication Skills Desire for Improvement Physical Health Resilience Social Support Talents/Skills  ADL's:  Intact  Cognition: WNL  Sleep:  Good   Screenings: PHQ2-9    Flowsheet Row Video Visit from 02/15/2022 in [facility] at [place] Video Visit from 12/08/2021 in [facility] at [place] Video Visit from 11/17/2021 in [facility] Video Visit from 08/19/2021 in [facility] at [place] Video Visit from 04/22/2021 in [facility] at [facility] Total Score 0 0 6 0 0  PHQ-9 Total Score -- -- 6 -- --      Flowsheet Row Video Visit from 02/15/2022 in [facility] at [place] Video Visit from 08/19/2021 in [facility] at [place] Video Visit from 04/22/2021 in [facility] at [place]  C-SSRS RISK CATEGORY No Risk No Risk No Risk        Assessment and Plan:  This patient is a 52 year old female with a history of depression and anxiety.  She is continuing to do well on her current regimen.  She will continue Prozac 60 mg daily for depression, Xanax 1 mg 3 times daily for anxiety and Ambien 10 mg at bedtime for sleep.  She will return to see me in 4 months Collaboration of Care: Collaboration of Care: Primary Care Provider AEB notes will be shared with PCP at patient's request  Patient/Guardian was advised Release of Information must be obtained prior to any record release in order to collaborate their care with an outside provider. Patient/Guardian was advised if they have not already done so to contact the registration department to sign all necessary forms in order for [place] to release information regarding their care.   Consent: Patient/Guardian gives verbal consent for treatment and assignment of benefits for  services provided during this visit. Patient/Guardian expressed understanding and agreed to proceed.    Diannia Ruder, MD 10/31/2022, 3:26 PM

## 2022-11-22 ENCOUNTER — Other Ambulatory Visit (HOSPITAL_COMMUNITY): Payer: Self-pay | Admitting: Psychiatry

## 2023-02-13 ENCOUNTER — Encounter (HOSPITAL_COMMUNITY): Payer: Self-pay | Admitting: Psychiatry

## 2023-02-13 ENCOUNTER — Telehealth (INDEPENDENT_AMBULATORY_CARE_PROVIDER_SITE_OTHER): Payer: Self-pay | Admitting: Psychiatry

## 2023-02-13 DIAGNOSIS — F331 Major depressive disorder, recurrent, moderate: Secondary | ICD-10-CM

## 2023-02-13 MED ORDER — FLUOXETINE HCL 20 MG PO CAPS: 20.0000 mg | ORAL_CAPSULE | Freq: Every day | ORAL | 2 refills | Status: DC

## 2023-02-13 MED ORDER — FLUOXETINE HCL 40 MG PO CAPS: 40.0000 mg | ORAL_CAPSULE | Freq: Every day | ORAL | 2 refills | Status: DC

## 2023-02-13 MED ORDER — ALPRAZOLAM 1 MG PO TABS
1.0000 mg | ORAL_TABLET | Freq: Three times a day (TID) | ORAL | 3 refills | Status: DC | PRN
Start: 2023-02-13 — End: 2023-06-19

## 2023-02-13 MED ORDER — ZOLPIDEM TARTRATE 10 MG PO TABS: 10.0000 mg | ORAL_TABLET | Freq: Every evening | ORAL | 3 refills | Status: DC | PRN

## 2023-02-13 NOTE — Progress Notes (Signed)
Virtual Visit via Video Note  I connected with Natalie Walker on 02/13/23 at  1:20 PM EDT by a video enabled telemedicine application and verified that I am speaking with the correct person using two identifiers.  Location: Patient: home Provider: office   I discussed the limitations of evaluation and management by telemedicine and the availability of in person appointments. The patient expressed understanding and agreed to proceed.     I discussed the assessment and treatment plan with the patient. The patient was provided an opportunity to ask questions and all were answered. The patient agreed with the plan and demonstrated an understanding of the instructions.   The patient was advised to call back or seek an in-person evaluation if the symptoms worsen or if the condition fails to improve as anticipated.  I provided 15 minutes of non-face-to-face time during this encounter.   Diannia Ruder, MD  [facility] MD/PA/NP OP Progress Note  02/13/2023 1:37 PM Natalie Walker  MRN:  [id number]  Chief Complaint:  Chief Complaint  Patient presents with   Depression   Anxiety   Follow-up   HPI: This patient is a 52 year old separated white female who lives with her 2 children in [place].  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  The patient returns for follow-up after 4 months regarding her depression anxiety and insomnia.  For the most part she continues to do well.  She is getting nutritional assessment for the bariatric program and is looking to get bariatric surgery next year.  However I urged her to be cautious as there are no new GLP-1 drugs that help with weight loss with more less risk.  She is aware of it but insurance is not covering these but eventually she should be able to get that because she is type II diabetic.  She states that her mood is stable and she denies significant anxiety depression thoughts of self harm.  She is sleeping well and trying to get more active Visit Diagnosis:     ICD-10-CM   1. Major depressive disorder, recurrent episode, moderate (HCC)  F33.1 ALPRAZolam (XANAX) 1 MG tablet      Past Psychiatric History: Past outpatient treatment in our office  Past Medical History:  Past Medical History:  Diagnosis Date   Anxiety    Asthma    Chest pain    NO CAD by cath 12/12/11, nl LV fxn   Depression    Headache(784.0)    Hypertriglyceridemia    Obesity    Polycystic ovarian disease    Tobacco abuse     Past Surgical History:  Procedure Laterality Date   CARDIAC CATHETERIZATION     CESAREAN SECTION     CHOLECYSTECTOMY     DILATION AND CURETTAGE OF UTERUS     ECTOPIC PREGNANCY SURGERY     LEFT HEART CATHETERIZATION WITH CORONARY ANGIOGRAM N/A 12/12/2011   Procedure: LEFT HEART CATHETERIZATION WITH CORONARY ANGIOGRAM;  Surgeon: Tonny Bollman, MD;  Location: [facility] CATH [facility];  Service: Cardiovascular;  Laterality: N/A;   TUBAL LIGATION      Family Psychiatric History: See below  Family History:  Family History  Problem Relation Age of Onset   Depression Son    ADD / ADHD Son    Depression Paternal Aunt    Depression Paternal Uncle     Social History:  Social History   Socioeconomic History   Marital status: Legally Separated    Spouse name: Not on file   Number of  children: Not on file   Years of education: Not on file   Highest education level: Not on file  Occupational History   Not on file  Tobacco Use   Smoking status: Every Day    Current packs/day: 0.50    Average packs/day: 0.5 packs/day for 20.0 years (10.0 ttl pk-yrs)    Types: Cigarettes   Smokeless tobacco: Never  Vaping Use   Vaping status: Never Used  Substance and Sexual Activity   Alcohol use: Not Currently    Comment: occasional use   Drug use: No   Sexual activity: Yes    Partners: Male    Birth control/protection: Surgical, I.U.D.  Other Topics Concern   Not on file  Social History Narrative   Not on file   Social Determinants of Health   Financial  Resource Strain: Not on file  Food Insecurity: Not on file  Transportation Needs: Not on file  Physical Activity: Not on file  Stress: Not on file  Social Connections: Not on file    Allergies: No Known Allergies  Metabolic Disorder Labs: No results found for: "HGBA1C", "MPG" No results found for: "PROLACTIN" Lab Results  Component Value Date   CHOL 149 04/08/2012   TRIG 138 04/08/2012   HDL 36 (L) 04/08/2012   CHOLHDL 4.1 04/08/2012   VLDL 28 04/08/2012   LDLCALC 85 04/08/2012   LDLCALC 83 12/13/2011   Lab Results  Component Value Date   TSH 1.716 04/08/2012   TSH 1.518 12/12/2011    Therapeutic Level Labs: No results found for: "LITHIUM" No results found for: "VALPROATE" No results found for: "CBMZ"  Current Medications: Current Outpatient Medications  Medication Sig Dispense Refill   ALPRAZolam (XANAX) 1 MG tablet Take 1 tablet (1 mg total) by mouth 3 (three) times daily as needed. for anxiety 90 tablet 3   FLUoxetine (PROZAC) 20 MG capsule Take 1 capsule (20 mg total) by mouth daily. 90 capsule 2   FLUoxetine (PROZAC) 40 MG capsule Take 1 capsule (40 mg total) by mouth daily. 90 capsule 2   ibuprofen (ADVIL,MOTRIN) 600 MG tablet Take 1 tablet (600 mg total) by mouth every 6 (six) hours as needed. 30 tablet 0   metFORMIN (GLUCOPHAGE) 1000 MG tablet Take 2,000 mg by mouth at bedtime.     methocarbamol (ROBAXIN) 500 MG tablet Take 500 mg by mouth at bedtime.     TRULICITY 0.75 MG/0.5ML SOPN Inject into the skin.     zolpidem (AMBIEN) 10 MG tablet Take 1 tablet (10 mg total) by mouth at bedtime as needed. for sleep 30 tablet 3   No current facility-administered medications for this visit.     Musculoskeletal: Strength & Muscle Tone: within normal limits Gait & Station: normal Patient leans: N/A  Psychiatric Specialty Exam: Review of Systems  Musculoskeletal:  Positive for back pain.  All other systems reviewed and are negative.   There were no vitals taken  for this visit.There is no height or weight on file to calculate BMI.  General Appearance: Casual and Fairly Groomed  Eye Contact:  Good  Speech:  Clear and Coherent  Volume:  Normal  Mood:  Euthymic  Affect:  Congruent  Thought Process:  Goal Directed  Orientation:  Full (Time, Place, and Person)  Thought Content: WDL   Suicidal Thoughts:  No  Homicidal Thoughts:  No  Memory:  Immediate;   Good Recent;   Good Remote;   Good  Judgement:  Good  Insight:  Good  Psychomotor Activity:  Normal  Concentration:  Concentration: Good and Attention Span: Good  Recall:  Good  Fund of Knowledge: Good  Language: Good  Akathisia:  No  Handed:  Right  AIMS (if indicated): not done  Assets:  Communication Skills Desire for Improvement Physical Health Resilience Social Support Talents/Skills  ADL's:  Intact  Cognition: WNL  Sleep:  Good   Screenings: PHQ2-9    Flowsheet Row Video Visit from 02/15/2022 in [facility] at [place] Video Visit from 12/08/2021 in [facility] at [place] Video Visit from 11/17/2021 in [facility] at [place] Video Visit from 08/19/2021 in [facility] at [place] Video Visit from 04/22/2021 in [facility] at [facility] Total Score 0 0 6 0 0  PHQ-9 Total Score -- -- 6 -- --      Flowsheet Row Video Visit from 02/15/2022 in [facility] at [place] Video Visit from 08/19/2021 in [facility] at [place] Video Visit from 04/22/2021 in [facility] at [place]  C-SSRS RISK CATEGORY No Risk No Risk No Risk        Assessment and Plan: This patient is a 52 year old female with a history of depression and anxiety.  She continues to do well on her current regimen.  She will continue Prozac 60 mg daily for depression, Xanax 1 mg 3  times daily for anxiety and Ambien 10 mg at bedtime for sleep.  She will return to see me in 4 months.  Collaboration of Care: Collaboration of Care: Primary Care Provider AEB notes to be shared with PCP at patient's request  Patient/Guardian was advised Release of Information must be obtained prior to any record release in order to collaborate their care with an outside provider. Patient/Guardian was advised if they have not already done so to contact the registration department to sign all necessary forms in order for [place] to release information regarding their care.   Consent: Patient/Guardian gives verbal consent for treatment and assignment of benefits for services provided during this visit. Patient/Guardian expressed understanding and agreed to proceed.    Diannia Ruder, MD 02/13/2023, 1:37 PM

## 2023-06-19 ENCOUNTER — Encounter (HOSPITAL_COMMUNITY): Payer: Self-pay | Admitting: Psychiatry

## 2023-06-19 ENCOUNTER — Telehealth (HOSPITAL_COMMUNITY): Payer: Self-pay | Admitting: Psychiatry

## 2023-06-19 DIAGNOSIS — F419 Anxiety disorder, unspecified: Secondary | ICD-10-CM

## 2023-06-19 DIAGNOSIS — F331 Major depressive disorder, recurrent, moderate: Secondary | ICD-10-CM

## 2023-06-19 MED ORDER — FLUOXETINE HCL 40 MG PO CAPS: 40.0000 mg | ORAL_CAPSULE | Freq: Every day | ORAL | 2 refills | Status: DC

## 2023-06-19 MED ORDER — ZOLPIDEM TARTRATE 10 MG PO TABS: 10.0000 mg | ORAL_TABLET | Freq: Every evening | ORAL | 3 refills | Status: DC | PRN

## 2023-06-19 MED ORDER — FLUOXETINE HCL 20 MG PO CAPS: 20.0000 mg | ORAL_CAPSULE | Freq: Every day | ORAL | 2 refills | Status: DC

## 2023-06-19 MED ORDER — ALPRAZOLAM 1 MG PO TABS: 1.0000 mg | ORAL_TABLET | Freq: Three times a day (TID) | ORAL | 3 refills | Status: DC | PRN

## 2023-06-19 NOTE — Progress Notes (Signed)
 Virtual Visit via Video Note  I connected with Natalie Walker on 06/19/23 at  1:40 PM EST by a video enabled telemedicine application and verified that I am speaking with the correct person using two identifiers.  Location: Patient: home Provider: office   I discussed the limitations of evaluation and management by telemedicine and the availability of in person appointments. The patient expressed understanding and agreed to proceed.      I discussed the assessment and treatment plan with the patient. The patient was provided an opportunity to ask questions and all were answered. The patient agreed with the plan and demonstrated an understanding of the instructions.   The patient was advised to call back or seek an in-person evaluation if the symptoms worsen or if the condition fails to improve as anticipated.  I provided 20 minutes of non-face-to-face time during this encounter.   Barnie Gull, MD  [facility] MD/PA/NP OP Progress Note  06/19/2023 1:42 PM Natalie Walker  MRN:  [id number]  Chief Complaint:  Chief Complaint  Patient presents with   Depression   Anxiety   ADD   Follow-up   HPI: This patient is a 53 year old separated white female lives with her 2 children in [place].  She works as a LAWYER for estate agent.  The patient returns for follow-up after 4 months regarding her depression anxiety and insomnia.  For the most part she is doing well.  The patient that she was caring for her in her home died last week and she has attended the funeral.  Unfortunately she caught a bad cold there.  She is planning to take on the care of her ex father-in-law.  Her mood has been stable she denies significant depression or anxiety and she is sleeping well.  She was looking at bariatric surgery but with the help of Trulicity and nutritional consult she is lost 40 pounds on her own Visit Diagnosis:    ICD-10-CM   1. Anxiety  F41.9     2. Major depressive disorder, recurrent episode, moderate  (HCC)  F33.1 ALPRAZolam  (XANAX ) 1 MG tablet      Past Psychiatric History: Past outpatient treatment in our office  Past Medical History:  Past Medical History:  Diagnosis Date   Anxiety    Asthma    Chest pain    NO CAD by cath 12/12/11, nl LV fxn   Depression    Headache(784.0)    Hypertriglyceridemia    Obesity    Polycystic ovarian disease    Tobacco abuse     Past Surgical History:  Procedure Laterality Date   CARDIAC CATHETERIZATION     CESAREAN SECTION     CHOLECYSTECTOMY     DILATION AND CURETTAGE OF UTERUS     ECTOPIC PREGNANCY SURGERY     LEFT HEART CATHETERIZATION WITH CORONARY ANGIOGRAM N/A 12/12/2011   Procedure: LEFT HEART CATHETERIZATION WITH CORONARY ANGIOGRAM;  Surgeon: Ozell Fell, MD;  Location: [facility] CATH LAB;  Service: Cardiovascular;  Laterality: N/A;   TUBAL LIGATION      Family Psychiatric History: See below  Family History:  Family History  Problem Relation Age of Onset   Depression Son    ADD / ADHD Son    Depression Paternal Aunt    Depression Paternal Uncle     Social History:  Social History   Socioeconomic History   Marital status: Legally Separated    Spouse name: Not on file   Number of children: Not on file  Years of education: Not on file   Highest education level: Not on file  Occupational History   Not on file  Tobacco Use   Smoking status: Every Day    Current packs/day: 0.50    Average packs/day: 0.5 packs/day for 20.0 years (10.0 ttl pk-yrs)    Types: Cigarettes   Smokeless tobacco: Never  Vaping Use   Vaping status: Never Used  Substance and Sexual Activity   Alcohol use: Not Currently    Comment: occasional use   Drug use: No   Sexual activity: Yes    Partners: Male    Birth control/protection: Surgical, I.U.D.  Other Topics Concern   Not on file  Social History Narrative   Not on file   Social Drivers of Health   Financial Resource Strain: Not on file  Food Insecurity: Not on file  Transportation  Needs: Not on file  Physical Activity: Not on file  Stress: Not on file  Social Connections: Not on file    Allergies: No Known Allergies  Metabolic Disorder Labs: No results found for: HGBA1C, MPG No results found for: PROLACTIN Lab Results  Component Value Date   CHOL 149 04/08/2012   TRIG 138 04/08/2012   HDL 36 (L) 04/08/2012   CHOLHDL 4.1 04/08/2012   VLDL 28 04/08/2012   LDLCALC 85 04/08/2012   LDLCALC 83 12/13/2011   Lab Results  Component Value Date   TSH 1.716 04/08/2012   TSH 1.518 12/12/2011    Therapeutic Level Labs: No results found for: LITHIUM No results found for: VALPROATE No results found for: CBMZ  Current Medications: Current Outpatient Medications  Medication Sig Dispense Refill   ALPRAZolam  (XANAX ) 1 MG tablet Take 1 tablet (1 mg total) by mouth 3 (three) times daily as needed. for anxiety 90 tablet 3   FLUoxetine  (PROZAC ) 20 MG capsule Take 1 capsule (20 mg total) by mouth daily. 90 capsule 2   FLUoxetine  (PROZAC ) 40 MG capsule Take 1 capsule (40 mg total) by mouth daily. 90 capsule 2   ibuprofen  (ADVIL ,MOTRIN ) 600 MG tablet Take 1 tablet (600 mg total) by mouth every 6 (six) hours as needed. 30 tablet 0   metFORMIN  (GLUCOPHAGE ) 1000 MG tablet Take 2,000 mg by mouth at bedtime.     methocarbamol (ROBAXIN) 500 MG tablet Take 500 mg by mouth at bedtime.     TRULICITY 0.75 MG/0.5ML SOPN Inject into the skin.     zolpidem  (AMBIEN ) 10 MG tablet Take 1 tablet (10 mg total) by mouth at bedtime as needed. for sleep 30 tablet 3   No current facility-administered medications for this visit.     Musculoskeletal: Strength & Muscle Tone: within normal limits Gait & Station: normal Patient leans: N/A  Psychiatric Specialty Exam: Review of Systems  All other systems reviewed and are negative.   There were no vitals taken for this visit.There is no height or weight on file to calculate BMI.  General Appearance: Casual and Fairly Groomed   Eye Contact:  Good  Speech:  Clear and Coherent  Volume:  Normal  Mood:  Euthymic  Affect:  Congruent  Thought Process:  Goal Directed  Orientation:  Full (Time, Place, and Person)  Thought Content: WDL   Suicidal Thoughts:  No  Homicidal Thoughts:  No  Memory:  Immediate;   Good Recent;   Good Remote;   Good  Judgement:  Good  Insight:  Good  Psychomotor Activity:  Normal  Concentration:  Concentration: Good and Attention Span: Good  Recall:  Good  Fund of Knowledge: Good  Language: Good  Akathisia:  No  Handed:  Right  AIMS (if indicated): not done  Assets:  Communication Skills Desire for Improvement Physical Health Resilience Social Support Talents/Skills  ADL's:  Intact  Cognition: WNL  Sleep:  Good   Screenings: PHQ2-9    Flowsheet Row Video Visit from 02/15/2022 in [facility] at [place] Video Visit from 12/08/2021 in [facility] at [place] Video Visit from 11/17/2021 in [facility] at [place] Video Visit from 08/19/2021 in [facility] at [place] Video Visit from 04/22/2021 in [facility] Total Score 0 0 6 0 0  PHQ-9 Total Score -- -- 6 -- --      Flowsheet Row Video Visit from 02/15/2022 in [facility] at [place] Video Visit from 08/19/2021 in [facility] at [place] Video Visit from 04/22/2021 in [facility] Outpatient Behavioral [facility] at [place]  C-SSRS RISK CATEGORY No Risk No Risk No Risk        Assessment and Plan: This patient is a 53 year old female with a history of depression and anxiety.  She continues to do well on her current regimen.  She will continue Prozac  60 mg daily for depression, Xanax  1 mg 3 times daily for anxiety and Ambien  10 mg at bedtime for sleep.  She will return to see me in 4 months  Collaboration  of Care: Collaboration of Care: Primary Care Provider AEB notes will be shared with PCP at patient's request  Patient/Guardian was advised Release of Information must be obtained prior to any record release in order to collaborate their care with an outside provider. Patient/Guardian was advised if they have not already done so to contact the registration department to sign all necessary forms in order for us  to release information regarding their care.   Consent: Patient/Guardian gives verbal consent for treatment and assignment of benefits for services provided during this visit. Patient/Guardian expressed understanding and agreed to proceed.    Barnie Gull, MD 06/19/2023, 1:42 PM

## 2023-07-07 ENCOUNTER — Other Ambulatory Visit (HOSPITAL_COMMUNITY): Payer: Self-pay | Admitting: Psychiatry

## 2023-07-07 DIAGNOSIS — F331 Major depressive disorder, recurrent, moderate: Secondary | ICD-10-CM

## 2023-10-17 ENCOUNTER — Encounter (HOSPITAL_COMMUNITY): Payer: Self-pay | Admitting: Psychiatry

## 2023-10-17 ENCOUNTER — Telehealth (INDEPENDENT_AMBULATORY_CARE_PROVIDER_SITE_OTHER): Payer: Medicaid - Out of State | Admitting: Psychiatry

## 2023-10-17 DIAGNOSIS — F331 Major depressive disorder, recurrent, moderate: Secondary | ICD-10-CM | POA: Diagnosis not present

## 2023-10-17 DIAGNOSIS — F419 Anxiety disorder, unspecified: Secondary | ICD-10-CM

## 2023-10-17 MED ORDER — ALPRAZOLAM 1 MG PO TABS: 1.0000 mg | ORAL_TABLET | Freq: Three times a day (TID) | ORAL | 3 refills | Status: DC | PRN

## 2023-10-17 MED ORDER — ZOLPIDEM TARTRATE 10 MG PO TABS: 10.0000 mg | ORAL_TABLET | Freq: Every evening | ORAL | 3 refills | Status: DC | PRN

## 2023-10-17 MED ORDER — FLUOXETINE HCL 20 MG PO CAPS: 20.0000 mg | ORAL_CAPSULE | Freq: Every day | ORAL | 2 refills | Status: DC

## 2023-10-17 MED ORDER — FLUOXETINE HCL 40 MG PO CAPS: 40.0000 mg | ORAL_CAPSULE | Freq: Every day | ORAL | 2 refills | Status: DC

## 2023-10-17 NOTE — Progress Notes (Signed)
 Virtual Visit via Video Note  I connected with Natalie Walker on 10/17/23 at  1:00 PM EDT by a video enabled telemedicine application and verified that I am speaking with the correct person using two identifiers.  Location: Patient: home Provider: office   I discussed the limitations of evaluation and management by telemedicine and the availability of in person appointments. The patient expressed understanding and agreed to proceed.      I discussed the assessment and treatment plan with the patient. The patient was provided an opportunity to ask questions and all were answered. The patient agreed with the plan and demonstrated an understanding of the instructions.   The patient was advised to call back or seek an in-person evaluation if the symptoms worsen or if the condition fails to improve as anticipated.  I provided 20 minutes of non-face-to-face time during this encounter.   Alfredia Annas, MD  [facility] MD/PA/NP OP Progress Note  10/17/2023 1:08 PM Natalie Walker  MRN:  [id number]  Chief Complaint:  Chief Complaint  Patient presents with   Anxiety   Depression   Follow-up   HPI: This patient is a 53 year old separated white female lives with her 2 children in [place]. 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   The patient returns for follow-up after 4 months regarding her anxiety depression and insomnia.  She states that she is doing very well.  She has lost 70 pounds with dieting and being on Trulicity.  She is now taking care of her ex father-in-law through the home health agency.  Her 52 year old son and his girlfriend had a baby and she is enjoying her new grandson.  She states that her mood is stable she denies significant depression anxiety or difficulty sleeping.  She denies suicidal thoughts Visit Diagnosis:    ICD-10-CM   1. Anxiety  F41.9     2. Major depressive disorder, recurrent episode, moderate (HCC)  F33.1 ALPRAZolam  (XANAX ) 1 MG tablet      Past Psychiatric  History: Past outpatient treatment  Past Medical History:  Past Medical History:  Diagnosis Date   Anxiety    Asthma    Chest pain    NO CAD by cath 12/12/11, nl LV fxn   Depression    Headache(784.0)    Hypertriglyceridemia    Obesity    Polycystic ovarian disease    Tobacco abuse     Past Surgical History:  Procedure Laterality Date   CARDIAC CATHETERIZATION     CESAREAN SECTION     CHOLECYSTECTOMY     DILATION AND CURETTAGE OF UTERUS     ECTOPIC PREGNANCY SURGERY     LEFT HEART CATHETERIZATION WITH CORONARY ANGIOGRAM N/A 12/12/2011   Procedure: LEFT HEART CATHETERIZATION WITH CORONARY ANGIOGRAM;  Surgeon: Arnoldo Lapping, MD;  Location: [facility] CATH LAB;  Service: Cardiovascular;  Laterality: N/A;   TUBAL LIGATION      Family Psychiatric History: See below  Family History:  Family History  Problem Relation Age of Onset   Depression Son    ADD / ADHD Son    Depression Paternal Aunt    Depression Paternal Uncle     Social History:  Social History   Socioeconomic History   Marital status: Legally Separated    Spouse name: Not on file   Number of children: Not on file   Years of education: Not on file   Highest education level: Not on file  Occupational History   Not on file  Tobacco  Use   Smoking status: Every Day    Current packs/day: 0.50    Average packs/day: 0.5 packs/day for 20.0 years (10.0 ttl pk-yrs)    Types: Cigarettes   Smokeless tobacco: Never  Vaping Use   Vaping status: Never Used  Substance and Sexual Activity   Alcohol use: Not Currently    Comment: occasional use   Drug use: No   Sexual activity: Yes    Partners: Male    Birth control/protection: Surgical, I.U.D.  Other Topics Concern   Not on file  Social History Narrative   Not on file   Social Drivers of Health   Financial Resource Strain: Not on file  Food Insecurity: Not on file  Transportation Needs: Not on file  Physical Activity: Not on file  Stress: Not on file  Social  Connections: Not on file    Allergies: No Known Allergies  Metabolic Disorder Labs: No results found for: "HGBA1C", "MPG" No results found for: "PROLACTIN" Lab Results  Component Value Date   CHOL 149 04/08/2012   TRIG 138 04/08/2012   HDL 36 (L) 04/08/2012   CHOLHDL 4.1 04/08/2012   VLDL 28 04/08/2012   LDLCALC 85 04/08/2012   LDLCALC 83 12/13/2011   Lab Results  Component Value Date   TSH 1.716 04/08/2012   TSH 1.518 12/12/2011    Therapeutic Level Labs: No results found for: "LITHIUM" No results found for: "VALPROATE" No results found for: "CBMZ"  Current Medications: Current Outpatient Medications  Medication Sig Dispense Refill   ALPRAZolam  (XANAX ) 1 MG tablet Take 1 tablet (1 mg total) by mouth 3 (three) times daily as needed. for anxiety 90 tablet 3   FLUoxetine  (PROZAC ) 20 MG capsule Take 1 capsule (20 mg total) by mouth daily. 90 capsule 2   FLUoxetine  (PROZAC ) 40 MG capsule Take 1 capsule (40 mg total) by mouth daily. 90 capsule 2   ibuprofen  (ADVIL ,MOTRIN ) 600 MG tablet Take 1 tablet (600 mg total) by mouth every 6 (six) hours as needed. 30 tablet 0   methocarbamol (ROBAXIN) 500 MG tablet Take 500 mg by mouth at bedtime.     TRULICITY 0.75 MG/0.5ML SOPN Inject into the skin.     zolpidem  (AMBIEN ) 10 MG tablet Take 1 tablet (10 mg total) by mouth at bedtime as needed. for sleep 30 tablet 3   No current facility-administered medications for this visit.     Musculoskeletal: Strength & Muscle Tone: within normal limits Gait & Station: normal Patient leans: N/A  Psychiatric Specialty Exam: Review of Systems  All other systems reviewed and are negative.   There were no vitals taken for this visit.There is no height or weight on file to calculate BMI.  General Appearance: Neat and Well Groomed  Eye Contact:  Good  Speech:  Clear and Coherent  Volume:  Normal  Mood:  Euthymic  Affect:  Congruent  Thought Process:  Goal Directed  Orientation:  Full  (Time, Place, and Person)  Thought Content: WDL   Suicidal Thoughts:  No  Homicidal Thoughts:  No  Memory:  Immediate;   Good Recent;   Good Remote;   Fair  Judgement:  Good  Insight:  Good  Psychomotor Activity:  Normal  Concentration:  Concentration: Good and Attention Span: Good  Recall:  Good  Fund of Knowledge: Good  Language: Good  Akathisia:  No  Handed:  Right  AIMS (if indicated): not done  Assets:  Communication Skills Desire for Improvement Physical Health Resilience Social Support Talents/Skills  Vocational/Educational  ADL's:  Intact  Cognition: WNL  Sleep:  Good   Screenings: PHQ2-9    Flowsheet Row Video Visit from 02/15/2022 in [facility] at [place] Video Visit from 12/08/2021 in [facility] at [place] Video Visit from 11/17/2021 in [facility] Outpatient Behavior[facility] at [place] Video Visit from 08/19/2021 in [facility] Outpatient Behavioral [facility] at [place] Video Visit from 04/22/2021 in [facility] at [facility] Total Score 0 0 6 0 0  PHQ-9 Total Score -- -- 6 -- --      Flowsheet Row Video Visit from 02/15/2022 in [facility] at [place] Video Visit from 08/19/2021 in [facility] Outpatient [facility] at [place] Video Visit from 04/22/2021 in [facility] at [place]  C-SSRS RISK CATEGORY No Risk No Risk No Risk        Assessment and Plan: This patient is a 53 year old female with history depression and anxiety.  She continues to do well on her current regimen.  She will continue Prozac  60 mg daily for depression, Xanax  1 mg 3 times daily for anxiety and Ambien  10 mg at bedtime for sleep.  She will return to see me in 3 to 4 months  Collaboration of Care: Collaboration of Care: Primary Care Provider AEB notes will be shared with PCP at patient's request  Patient/Guardian was  advised Release of Information must be obtained prior to any record release in order to collaborate their care with an outside provider. Patient/Guardian was advised if they have not already done so to contact the registration department to sign all necessary forms in order for us  to release information regarding their care.   Consent: Patient/Guardian gives verbal consent for treatment and assignment of benefits for services provided during this visit. Patient/Guardian expressed understanding and agreed to proceed.    Alfredia Annas, MD 10/17/2023, 1:08 PM

## 2023-10-24 ENCOUNTER — Other Ambulatory Visit (HOSPITAL_COMMUNITY): Payer: Self-pay | Admitting: Psychiatry

## 2023-10-24 DIAGNOSIS — F331 Major depressive disorder, recurrent, moderate: Secondary | ICD-10-CM

## 2024-02-18 ENCOUNTER — Encounter (HOSPITAL_COMMUNITY): Payer: Self-pay | Admitting: Psychiatry

## 2024-02-18 ENCOUNTER — Telehealth (INDEPENDENT_AMBULATORY_CARE_PROVIDER_SITE_OTHER): Payer: Medicaid - Out of State | Admitting: Psychiatry

## 2024-02-18 DIAGNOSIS — F331 Major depressive disorder, recurrent, moderate: Secondary | ICD-10-CM

## 2024-02-18 MED ORDER — FLUOXETINE HCL 40 MG PO CAPS: 40.0000 mg | ORAL_CAPSULE | Freq: Every day | ORAL | 2 refills | Status: AC

## 2024-02-18 MED ORDER — ZOLPIDEM TARTRATE 10 MG PO TABS: 10.0000 mg | ORAL_TABLET | Freq: Every evening | ORAL | 3 refills | Status: AC | PRN

## 2024-02-18 MED ORDER — FLUOXETINE HCL 20 MG PO CAPS: 20.0000 mg | ORAL_CAPSULE | Freq: Every day | ORAL | 2 refills | Status: AC

## 2024-02-18 MED ORDER — ALPRAZOLAM 1 MG PO TABS: 1.0000 mg | ORAL_TABLET | Freq: Three times a day (TID) | ORAL | 3 refills | Status: AC | PRN
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
## 2024-03-02 ENCOUNTER — Other Ambulatory Visit (HOSPITAL_COMMUNITY): Payer: Self-pay | Admitting: Psychiatry

## 2024-03-02 DIAGNOSIS — F331 Major depressive disorder, recurrent, moderate: Secondary | ICD-10-CM

## 2024-06-30 ENCOUNTER — Telehealth (HOSPITAL_COMMUNITY): Admitting: Psychiatry
